# Patient Record
Sex: Female | Born: 1965 | Race: Black or African American | Hispanic: No | Smoking: Never smoker
Health system: Southern US, Community
[De-identification: ages and names within clinical notes are randomized; demographics above are authoritative.]

## PROBLEM LIST (undated history)

## (undated) DIAGNOSIS — E039 Hypothyroidism, unspecified: Secondary | ICD-10-CM

---

## 2015-12-01 HISTORY — PX: BIOPSY THYROID: PRO38

## 2018-07-30 ENCOUNTER — Encounter (HOSPITAL_COMMUNITY): Payer: Self-pay | Admitting: Emergency Medicine

## 2018-07-30 ENCOUNTER — Emergency Department (HOSPITAL_COMMUNITY): Payer: Self-pay

## 2018-07-30 ENCOUNTER — Observation Stay (HOSPITAL_COMMUNITY)
Admission: EM | Admit: 2018-07-30 | Discharge: 2018-07-31 | Disposition: A | Discharge status: Home / Self Care | Payer: Self-pay | Attending: Internal Medicine | Admitting: Internal Medicine

## 2018-07-30 DIAGNOSIS — M5021 Other cervical disc displacement,  high cervical region: Secondary | ICD-10-CM | POA: Insufficient documentation

## 2018-07-30 DIAGNOSIS — I2721 Secondary pulmonary arterial hypertension: Secondary | ICD-10-CM | POA: Insufficient documentation

## 2018-07-30 DIAGNOSIS — G9340 Encephalopathy, unspecified: Secondary | ICD-10-CM | POA: Insufficient documentation

## 2018-07-30 DIAGNOSIS — X58XXXD Exposure to other specified factors, subsequent encounter: Secondary | ICD-10-CM | POA: Insufficient documentation

## 2018-07-30 DIAGNOSIS — E876 Hypokalemia: Secondary | ICD-10-CM | POA: Insufficient documentation

## 2018-07-30 DIAGNOSIS — Z91040 Latex allergy status: Secondary | ICD-10-CM | POA: Insufficient documentation

## 2018-07-30 DIAGNOSIS — F419 Anxiety disorder, unspecified: Secondary | ICD-10-CM | POA: Insufficient documentation

## 2018-07-30 DIAGNOSIS — R55 Syncope and collapse: Principal | ICD-10-CM | POA: Insufficient documentation

## 2018-07-30 DIAGNOSIS — Z789 Other specified health status: Secondary | ICD-10-CM

## 2018-07-30 DIAGNOSIS — E039 Hypothyroidism, unspecified: Secondary | ICD-10-CM | POA: Diagnosis present

## 2018-07-30 DIAGNOSIS — Z79899 Other long term (current) drug therapy: Secondary | ICD-10-CM | POA: Insufficient documentation

## 2018-07-30 DIAGNOSIS — F32A Depression, unspecified: Secondary | ICD-10-CM | POA: Diagnosis present

## 2018-07-30 DIAGNOSIS — R4701 Aphasia: Secondary | ICD-10-CM | POA: Insufficient documentation

## 2018-07-30 DIAGNOSIS — M4802 Spinal stenosis, cervical region: Secondary | ICD-10-CM | POA: Insufficient documentation

## 2018-07-30 DIAGNOSIS — F329 Major depressive disorder, single episode, unspecified: Secondary | ICD-10-CM | POA: Insufficient documentation

## 2018-07-30 DIAGNOSIS — D509 Iron deficiency anemia, unspecified: Secondary | ICD-10-CM | POA: Insufficient documentation

## 2018-07-30 DIAGNOSIS — R4182 Altered mental status, unspecified: Secondary | ICD-10-CM

## 2018-07-30 DIAGNOSIS — Z8673 Personal history of transient ischemic attack (TIA), and cerebral infarction without residual deficits: Secondary | ICD-10-CM | POA: Insufficient documentation

## 2018-07-30 DIAGNOSIS — I340 Nonrheumatic mitral (valve) insufficiency: Secondary | ICD-10-CM | POA: Insufficient documentation

## 2018-07-30 DIAGNOSIS — E890 Postprocedural hypothyroidism: Secondary | ICD-10-CM | POA: Insufficient documentation

## 2018-07-30 DIAGNOSIS — S0232XD Fracture of orbital floor, left side, subsequent encounter for fracture with routine healing: Secondary | ICD-10-CM | POA: Insufficient documentation

## 2018-07-30 HISTORY — DX: Hypothyroidism, unspecified: E03.9

## 2018-07-30 LAB — I-STAT BETA HCG BLOOD, ED (MC, WL, AP ONLY)

## 2018-07-30 LAB — COMPREHENSIVE METABOLIC PANEL
ALBUMIN: 3.6 g/dL (ref 3.5–5.0)
ALT: 14 U/L (ref 0–44)
AST: 23 U/L (ref 15–41)
Alkaline Phosphatase: 60 U/L (ref 38–126)
Anion gap: 11 (ref 5–15)
BUN: 10 mg/dL (ref 6–20)
CHLORIDE: 104 mmol/L (ref 98–111)
CO2: 20 mmol/L — AB (ref 22–32)
Calcium: 8 mg/dL — ABNORMAL LOW (ref 8.9–10.3)
Creatinine, Ser: 0.95 mg/dL (ref 0.44–1.00)
GFR calc Af Amer: >60 mL/min (ref >60)
GFR calc non Af Amer: >60 mL/min (ref >60)
GLUCOSE: 169 mg/dL — AB (ref 70–99)
POTASSIUM: 3.2 mmol/L — AB (ref 3.5–5.1)
SODIUM: 135 mmol/L (ref 135–145)
Total Bilirubin: 0.5 mg/dL (ref 0.3–1.2)
Total Protein: 7.3 g/dL (ref 6.5–8.1)

## 2018-07-30 LAB — CBC
HEMATOCRIT: 29.9 % — AB (ref 36.0–46.0)
HEMOGLOBIN: 8.5 g/dL — AB (ref 12.0–15.0)
MCH: 19.5 pg — ABNORMAL LOW (ref 26.0–34.0)
MCHC: 28.4 g/dL — ABNORMAL LOW (ref 30.0–36.0)
MCV: 68.4 fL — ABNORMAL LOW (ref 78.0–100.0)
Platelets: 199 10*3/uL (ref 150–400)
RBC: 4.37 MIL/uL (ref 3.87–5.11)
RDW: 18.1 % — ABNORMAL HIGH (ref 11.5–15.5)
WBC: 4.3 10*3/uL (ref 4.0–10.5)

## 2018-07-30 LAB — I-STAT CHEM 8, ED
BUN: 10 mg/dL (ref 6–20)
CHLORIDE: 106 mmol/L (ref 98–111)
Calcium, Ion: 1.02 mmol/L — ABNORMAL LOW (ref 1.15–1.40)
Creatinine, Ser: 0.9 mg/dL (ref 0.44–1.00)
Glucose, Bld: 163 mg/dL — ABNORMAL HIGH (ref 70–99)
HEMATOCRIT: 30 % — AB (ref 36.0–46.0)
Hemoglobin: 10.2 g/dL — ABNORMAL LOW (ref 12.0–15.0)
Potassium: 3.2 mmol/L — ABNORMAL LOW (ref 3.5–5.1)
SODIUM: 139 mmol/L (ref 135–145)
TCO2: 22 mmol/L (ref 22–32)

## 2018-07-30 LAB — TSH: TSH: 10.639 u[IU]/mL — ABNORMAL HIGH (ref 0.350–4.500)

## 2018-07-30 LAB — I-STAT CG4 LACTIC ACID, ED
Lactic Acid, Venous: 3.01 mmol/L (ref 0.5–1.9)
Lactic Acid, Venous: 1.06 mmol/L (ref 0.5–1.9)

## 2018-07-30 LAB — T4, FREE: Free T4: 0.83 ng/dL (ref 0.82–1.77)

## 2018-07-30 LAB — I-STAT TROPONIN, ED: Troponin i, poc: 0 ng/mL (ref 0.00–0.08)

## 2018-07-30 LAB — CBG MONITORING, ED: GLUCOSE-CAPILLARY: 143 mg/dL — AB (ref 70–99)

## 2018-07-30 MED ORDER — SODIUM CHLORIDE 0.9 % IV BOLUS
1000.0000 mL | Freq: Once | INTRAVENOUS | Status: AC
  Administered 2018-07-30: 1000 mL via INTRAVENOUS

## 2018-07-30 MED ORDER — IOPAMIDOL (ISOVUE-370) INJECTION 76%
50.0000 mL | Freq: Once | INTRAVENOUS | Status: AC | PRN
  Administered 2018-07-30: 50 mL via INTRAVENOUS

## 2018-07-30 NOTE — ED Notes (Signed)
Taken to MRI 

## 2018-07-30 NOTE — ED Notes (Signed)
I Stat [place] Acid results of 3.01 reported to Graciella FreerLindsey Layden PA

## 2018-07-30 NOTE — ED Provider Notes (Signed)
[facility] EMERGENCY DEPARTMENT Provider Note   CSN: [id number] Arrival date & time: 07/30/18  2135     History   Chief Complaint Chief Complaint  Patient presents with  . Altered Mental Status    HPI Lisa Quinn is a 52 y.o. female possible history of thyroid issues who presents for evaluation of altered mental status.  Most of history is provided by EMS.  Patient was at a football game with her family.  They report that patient was standing up in cheering when all of a sudden she had a syncopal episode resulting in her falling backwards.  Family reported no seizure activity.  They state patient did have positive LOC.  They state that she hit her back on the bleacher but did not hit her head.  Since then, patient has had difficulty responding.  EMS reported that patient would only blink and move her eyes.  They report no verbal responses from patient.  Family at bedside states that patient had been in her normal state of health earlier today.  They reported that she had reported some slight dizziness earlier this afternoon but after not had felt better.  They state that she did not have any preceding chest pain or dizziness.   EM level 5 caveat due to patient's AMS.   The history is provided by the EMS personnel.    History reviewed. No pertinent past medical history.  Patient Active Problem List   Diagnosis Date Noted  . Syncope 07/31/2018  . Microcytic anemia 07/31/2018  . Hypokalemia 07/31/2018  . Hypothyroidism 07/31/2018  . Acute encephalopathy 07/31/2018    The histories are not reviewed yet. Please review them in the "History" navigator section and refresh this SmartLink.   OB History   None      Home Medications    Prior to Admission medications   Not on File    Family History History reviewed. No pertinent family history.  Social History Social History   Tobacco Use  . Smoking status: Unknown If Ever Smoked  Substance Use Topics   . Alcohol use: Not on file  . Drug use: Not on file     Allergies   Patient has no allergy information on record.   Review of Systems Review of Systems  Unable to perform ROS: Mental status change     Physical Exam Updated Vital Signs BP 127/83   Pulse 63   Temp 98.7 F (37.1 C) (Axillary)   Resp 20   Ht 5\' 9"  (1.753 m)   Wt 86.2 kg   LMP 07/30/2018 (Exact Date)   SpO2 100%   BMI 28.06 kg/m   Physical Exam  Constitutional: She appears well-developed and well-nourished.  HENT:  Head: Normocephalic and atraumatic.  Mouth/Throat: Oropharynx is clear and moist and mucous membranes are normal.  No tenderness to palpation of skull. No deformities or crepitus noted. No open wounds, abrasions or lacerations.   Eyes: Conjunctivae and lids are normal.  Left pupil is fixed and dilated    Neck: Full passive range of motion without pain.  Cardiovascular: Normal rate, regular rhythm, normal heart sounds and normal pulses. Exam reveals no gallop and no friction rub.  No murmur heard. Pulmonary/Chest: Effort normal and breath sounds normal.  Lungs clear to auscultation bilaterally.  Symmetric chest rise.  No wheezing, rales, rhonchi. No tenderness to palpation noted to anterior chest well.   Abdominal: Soft. Normal appearance. There is no tenderness. There is no rigidity and no  guarding.  Abdomen is soft, non-distended, non-tender. No rigidity, No guarding. No peritoneal signs.  Musculoskeletal: Normal range of motion.  Neurological: She is alert.  Patient will open her eyes to verbal stimuli.  Initially she would not respond.  By the end of my evaluation, she would tell me her name and would spontaneously move her extremities.  She will intermittently follow commands but difficulty moving all extremities.  Patient is protecting airway.  Skin: Skin is warm and dry. Capillary refill takes less than 2 seconds.  Psychiatric:  Unable to assess secondary to patient's AMS.  Nursing note  and vitals reviewed.    ED Treatments / Results  Labs (all labs ordered are listed, but only abnormal results are displayed) Labs Reviewed  COMPREHENSIVE METABOLIC PANEL - Abnormal; Notable for the following components:      Result Value   Potassium 3.2 (*)    CO2 20 (*)    Glucose, Bld 169 (*)    Calcium 8.0 (*)    All other components within normal limits  CBC - Abnormal; Notable for the following components:   Hemoglobin 8.5 (*)    HCT 29.9 (*)    MCV 68.4 (*)    MCH 19.5 (*)    MCHC 28.4 (*)    RDW 18.1 (*)    All other components within normal limits  TSH - Abnormal; Notable for the following components:   TSH 10.639 (*)    All other components within normal limits  CBG MONITORING, ED - Abnormal; Notable for the following components:   Glucose-Capillary 143 (*)    All other components within normal limits  I-STAT CG4 LACTIC ACID, ED - Abnormal; Notable for the following components:   Lactic Acid, Venous 3.01 (*)    All other components within normal limits  I-STAT CHEM 8, ED - Abnormal; Notable for the following components:   Potassium 3.2 (*)    Glucose, Bld 163 (*)    Calcium, Ion 1.02 (*)    Hemoglobin 10.2 (*)    HCT 30.0 (*)    All other components within normal limits  T4, FREE  ETHANOL  PROTIME-INR  APTT  URINALYSIS, ROUTINE W REFLEX MICROSCOPIC  RAPID URINE DRUG SCREEN, HOSP PERFORMED  DIFFERENTIAL  I-STAT BETA HCG BLOOD, ED ([facility], [facility], [facility] ONLY)  I-STAT TROPONIN, ED  I-STAT CG4 LACTIC ACID, ED    EKG EKG Interpretation  Date/Time:  Saturday July 30 2018 21:43:27 EDT Ventricular Rate:  80 PR Interval:    QRS Duration: 102 QT Interval:  419 QTC Calculation: 484 R Axis:   51 Text Interpretation:  Sinus rhythm Borderline T abnormalities, anterior leads Confirmed by Virgina Norfolk [id number]) on 07/30/2018 9:57:42 PM   Radiology Ct Angio Head W Or Wo Contrast  Result Date: 07/30/2018 CLINICAL DATA:  Confusion. Syncopal episode. Found unresponsive  between bleachers at football game. Last seen normal at 2130 hours. EXAM: CT ANGIOGRAPHY HEAD AND NECK TECHNIQUE: Multidetector CT imaging of the head and neck was performed using the standard protocol during bolus administration of intravenous contrast. Multiplanar CT image reconstructions and MIPs were obtained to evaluate the vascular anatomy. Carotid stenosis measurements (when applicable) are obtained utilizing NASCET criteria, using the distal internal carotid diameter as the denominator. CONTRAST:  50mL ISOVUE-370 IOPAMIDOL (ISOVUE-370) INJECTION 76% COMPARISON:  CT HEAD July 20, 2018 at 2218 hours. FINDINGS: CTA NECK FINDINGS: AORTIC ARCH: Normal appearance of the thoracic arch, normal branch pattern. The origins of the innominate, left Common carotid artery and subclavian  artery are widely patent. RIGHT CAROTID SYSTEM: Common carotid artery is patent. Normal appearance of the carotid bifurcation without hemodynamically significant stenosis by NASCET criteria. Normal appearance of the internal carotid artery. LEFT CAROTID SYSTEM: Common carotid artery is patent. Normal appearance of the carotid bifurcation without hemodynamically significant stenosis by NASCET criteria. Normal appearance of the internal carotid artery. VERTEBRAL ARTERIES:Codominant vertebral arteries, widely patent. Mild extrinsic compression due to cervical spine. SKELETON: No acute osseous process though bone windows have not been submitted. OTHER NECK: Soft tissues of the neck are nonacute though, not tailored for evaluation. Thoracic dextroscoliosis. Coarse calcification anterior neck. UPPER CHEST: Biapical pleural thickening. LEFT apical granuloma. 3.7 cm enlarged main pulmonary artery associated with pulmonary arterial hypertension, incompletely evaluated. Status post thyroidectomy, subcentimeter residual thyroid tissue. Surgical clips in anterior mediastinum. Status post median sternotomy. CTA HEAD FINDINGS: ANTERIOR CIRCULATION:  Patent cervical internal carotid arteries, petrous, cavernous and supra clinoid internal carotid arteries. Patent anterior communicating artery. Patent anterior and middle cerebral arteries, mild luminal irregularity. No large vessel occlusion, significant stenosis, contrast extravasation or aneurysm. POSTERIOR CIRCULATION: Patent vertebral arteries, vertebrobasilar junction and basilar artery, as well as main branch vessels. Mild stenosis RIGHT V4 segment. Patent posterior cerebral arteries, mild luminal irregularity. Small bilateral posterior communicating arteries present. No large vessel occlusion, significant stenosis, contrast extravasation or aneurysm. VENOUS SINUSES: Major dural venous sinuses are patent though not tailored for evaluation on this angiographic examination. ANATOMIC VARIANTS: Hypoplastic RIGHT A1 segment.  LEFT DELAYED PHASE: Not performed. MIP images reviewed. IMPRESSION: CTA NECK: 1. No hemodynamically significant stenosis or acute vascular process in the neck. 2. Postoperative changes mediastinal, status post median sternotomy. 3. Enlarged main pulmonary artery associated with pulmonary arterial hypertension. CTA HEAD: 1. No emergent large vessel occlusion or flow-limiting stenosis. 2. Mild luminal irregularities seen with atherosclerosis, less likely vasculopathy. Critical Value/emergent results text paged to Dr.ASHISH ARORA via AMION secure system on 07/30/2018 at 10:35 pm, including interpreting physician's phone number. Electronically Signed   By: Awilda Metro M.D.   On: 07/30/2018 22:45   Ct Angio Neck W And/or Wo Contrast  Result Date: 07/30/2018 CLINICAL DATA:  Confusion. Syncopal episode. Found unresponsive between bleachers at football game. Last seen normal at 2130 hours. EXAM: CT ANGIOGRAPHY HEAD AND NECK TECHNIQUE: Multidetector CT imaging of the head and neck was performed using the standard protocol during bolus administration of intravenous contrast. Multiplanar CT  image reconstructions and MIPs were obtained to evaluate the vascular anatomy. Carotid stenosis measurements (when applicable) are obtained utilizing NASCET criteria, using the distal internal carotid diameter as the denominator. CONTRAST:  50mL ISOVUE-370 IOPAMIDOL (ISOVUE-370) INJECTION 76% COMPARISON:  CT HEAD July 20, 2018 at 2218 hours. FINDINGS: CTA NECK FINDINGS: AORTIC ARCH: Normal appearance of the thoracic arch, normal branch pattern. The origins of the innominate, left Common carotid artery and subclavian artery are widely patent. RIGHT CAROTID SYSTEM: Common carotid artery is patent. Normal appearance of the carotid bifurcation without hemodynamically significant stenosis by NASCET criteria. Normal appearance of the internal carotid artery. LEFT CAROTID SYSTEM: Common carotid artery is patent. Normal appearance of the carotid bifurcation without hemodynamically significant stenosis by NASCET criteria. Normal appearance of the internal carotid artery. VERTEBRAL ARTERIES:Codominant vertebral arteries, widely patent. Mild extrinsic compression due to cervical spine. SKELETON: No acute osseous process though bone windows have not been submitted. OTHER NECK: Soft tissues of the neck are nonacute though, not tailored for evaluation. Thoracic dextroscoliosis. Coarse calcification anterior neck. UPPER CHEST: Biapical pleural thickening. LEFT apical granuloma. 3.7  cm enlarged main pulmonary artery associated with pulmonary arterial hypertension, incompletely evaluated. Status post thyroidectomy, subcentimeter residual thyroid tissue. Surgical clips in anterior mediastinum. Status post median sternotomy. CTA HEAD FINDINGS: ANTERIOR CIRCULATION: Patent cervical internal carotid arteries, petrous, cavernous and supra clinoid internal carotid arteries. Patent anterior communicating artery. Patent anterior and middle cerebral arteries, mild luminal irregularity. No large vessel occlusion, significant stenosis,  contrast extravasation or aneurysm. POSTERIOR CIRCULATION: Patent vertebral arteries, vertebrobasilar junction and basilar artery, as well as main branch vessels. Mild stenosis RIGHT V4 segment. Patent posterior cerebral arteries, mild luminal irregularity. Small bilateral posterior communicating arteries present. No large vessel occlusion, significant stenosis, contrast extravasation or aneurysm. VENOUS SINUSES: Major dural venous sinuses are patent though not tailored for evaluation on this angiographic examination. ANATOMIC VARIANTS: Hypoplastic RIGHT A1 segment.  LEFT DELAYED PHASE: Not performed. MIP images reviewed. IMPRESSION: CTA NECK: 1. No hemodynamically significant stenosis or acute vascular process in the neck. 2. Postoperative changes mediastinal, status post median sternotomy. 3. Enlarged main pulmonary artery associated with pulmonary arterial hypertension. CTA HEAD: 1. No emergent large vessel occlusion or flow-limiting stenosis. 2. Mild luminal irregularities seen with atherosclerosis, less likely vasculopathy. Critical Value/emergent results text paged to Dr.ASHISH ARORA via AMION secure system on 07/30/2018 at 10:35 pm, including interpreting physician's phone number. Electronically Signed   By: Awilda Metro M.D.   On: 07/30/2018 22:45   Ct C-spine No Charge  Result Date: 07/30/2018 CLINICAL DATA:  Found unresponsive between bleachers at football game. EXAM: CT CERVICAL SPINE WITHOUT CONTRAST TECHNIQUE: Reconstructed CT imaging of the cervical spine was performed. COMPARISON:  None. FINDINGS: ALIGNMENT: Maintained lordosis. Vertebral bodies in alignment. SKULL BASE AND VERTEBRAE: Cervical vertebral bodies and posterior elements are intact. Developmentally long gated C6 spinous process directed to the LEFT. Dystrophic RIGHT C6 transverse process, anterior tubercle of C6 and C7 pseudoarthrosis. The posterior elements of T1 are developmentally unfused. Intervertebral disc heights preserved.  No destructive bony lesions. C1-2 articulation maintained. SOFT TISSUES AND SPINAL CANAL: Nonacute. DISC LEVELS: Moderate C3-4 disc protrusion resulting in at least moderate canal stenosis UPPER CHEST: Lung apices are clear. OTHER: None. IMPRESSION: 1. No fracture or malalignment. 2. Disc protrusion resulting in at least moderate canal stenosis C3-4. Electronically Signed   By: Awilda Metro M.D.   On: 07/30/2018 23:15   Ct Head Code Stroke Wo Contrast  Addendum Date: 07/30/2018   ADDENDUM REPORT: 07/30/2018 22:33 ADDENDUM: Partially empty sella. Electronically Signed   By: Awilda Metro M.D.   On: 07/30/2018 22:33   Result Date: 07/30/2018 CLINICAL DATA:  Code stroke.  Syncope and confusion. EXAM: CT HEAD WITHOUT CONTRAST TECHNIQUE: Contiguous axial images were obtained from the base of the skull through the vertex without intravenous contrast. COMPARISON:  None. FINDINGS: BRAIN: No intraparenchymal hemorrhage, mass effect nor midline shift. The ventricles and sulci are normal. No acute large vascular territory infarcts. No abnormal extra-axial fluid collections. Basal cisterns are patent. VASCULAR: Trace calcific atherosclerosis. SKULL/SOFT TISSUES: No skull fracture. No significant soft tissue swelling. ORBITS/SINUSES: Old LEFT medial orbital blowout fracture. Paranasal sinuses are well aerated. Mastoid air cells are well aerated. OTHER: None. ASPECTS [facility] Stroke Program Early CT Score) - Ganglionic level infarction (caudate, lentiform nuclei, internal capsule, insula, M1-M3 cortex): 7 - Supraganglionic infarction (M4-M6 cortex): 3 Total score (0-10 with 10 being normal): 10 IMPRESSION: 1. Negative CT HEAD without contrast. 2. ASPECTS is 10. 3. Critical Value/emergent results text paged to Dr.Arora, Neurology via AMION secure system on 07/30/2018 at 10:24 pm, including  interpreting physician's phone number. Electronically Signed: By: Awilda Metroourtnay  Bloomer M.D. On: 07/30/2018 22:25     Procedures .Critical Care Performed by: Maxwell CaulLayden, Esma Kilts A, PA-C Authorized by: Maxwell CaulLayden, Damoni Erker A, PA-C   Critical care provider statement:    Critical care time (minutes):  45   Critical care was time spent personally by me on the following activities:  Discussions with consultants, evaluation of patient's response to treatment, examination of patient, ordering and performing treatments and interventions, ordering and review of laboratory studies, ordering and review of radiographic studies, pulse oximetry, re-evaluation of patient's condition, obtaining history from patient or surrogate and review of old charts   (including critical care time)  Medications Ordered in ED Medications  iopamidol (ISOVUE-370) 76 % injection 50 mL (50 mLs Intravenous Contrast Given 07/30/18 2231)  sodium chloride 0.9 % bolus 1,000 mL (1,000 mLs Intravenous New Bag/Given 07/30/18 2322)     Initial Impression / Assessment and Plan / ED Course  I have reviewed the triage vital signs and the nursing notes.  Pertinent labs & imaging results that were available during my care of the patient were reviewed by me and considered in my medical decision making (see chart for details).     52 y.o. female brought in by EMS for AMS.  Patient was at a football game when all of a sudden she had a syncopal episode and lost consciousness.  Has had difficulty responding since then.  On initial EMS arrival, patient was unresponsive.  They state that she was breathing and would open her eyes but would not answer any questions.  On my evaluation, she started responding to verbal stimuli and will tell me her name.  She is able to squeeze my fingers and has spontaneous movement of her extremities but has difficulty following commands.  Left pupil was fixed and dilated on my exam.  Given concerns, a code stroke was initiated.  Discussed with Dr. Wilford CornerArora.  And for labs, CT head, CT C-spine.  CT C-spine shows no evidence of fracture.  CT  head negative for any acute intracranial hemorrhage.  I-STAT Chem-8 shows potassium 3.2.  CMP shows bicarb of 20.  Normal BUN and creatinine.  CBC shows hemoglobin is 8.5.  Review of patient records show that she does have a history of anemia.  Last CBC was approximately 1 year ago and her hemoglobin was 9 at that time.  Review of records show that she consistently runs between 8 and 10.  CBC without any significant leukocytosis.  CBG is 143.  I-STAT beta negative.  I-STAT troponin negative.  Discussed with Dr. Wilford CornerArora (Neuro) after CT had and personal evaluation.  Recommends obtaining CT a of head and neck and MRI of brain for stroke.  He believes that the eye is been from previous trauma.  Does not feel that it is acutely fixed and dilated.  He recommends reevaluation after imaging.  He will plan to consult.  Low suspicion for stroke at this time.  He will plan to consult after MRI for reevaluation.  Reevaluation.  Patient is spontaneously moving all extremities and is more responsive but is still not back to baseline.  Given concerns for altered mental status and atypical syncope, feel that admission for observation is best course.  Discussed patient with Dr. Antionette Charpyd (hospitalist).  Will admit patient.  Discussed patient with Dr. Wilford CornerArora (Neuro).  Given normal MRI, do not feel that this needs further neurology work-up.  Do not suspect that this is seizure.  He  will insert consult note and will be available for consult as needed but from a neuro standpoint, feel that patient is clear.  Final Clinical Impressions(s) / ED Diagnoses   Final diagnoses:  Altered mental status, unspecified altered mental status type  Syncope, unspecified syncope type    ED Discharge Orders    None       Maxwell Caul, PA-C 07/31/18 0159    Virgina Norfolk, DO 07/31/18 1109

## 2018-07-30 NOTE — ED Notes (Signed)
PAGED OUT STROKE @ 22:03 PER DR CURATOLO

## 2018-07-30 NOTE — Consult Note (Signed)
Neurology Consultation  Reason for Consult: Code stroke-weakness aphasia Referring Physician: Dr. Lockie Molauratolo  CC: Weakness aphasia after syncopal episode  History is obtained from: Patient's family at bedside  HPI: Lisa Quinn is a 52 y.o. female past medical history of hypothyroidism, depression/anxiety, fibrocystic breast disease, and anemia, who was visiting her son for his local college football game, and was sitting in the stadium in the bleachers when she had a sudden onset of fall backwards with questionable hitting her head on the bench followed by unresponsiveness and some stiffening of her body.  She was brought into the emergency room, evaluated by the ED providers and a code stroke was activated for concern for asymmetric pupils, aphasia and generalized weakness. The patient has not had similar episodes in the past per family.  She was recently diagnosed with fibrocystic breast disease and has had a lot of pain in her breast and started on opiates. [place] visiting from the [place] where she has [facility] care [facility]. She has been under a lot of personal stress lately according to another friend who was in the room. The son also said that she has a history of stroke in her early 6230s, reason unknown, no residual deficits.  She underwent thyroid surgery for removal of a goiter 2 years ago and had a prior thyroid surgery in 2007 for similar thyroid issues. Her current medications include citalopram 20, Naprosyn, oxycodone, levothyroxine. EMS did not notice seizure activity.  No meds were given. Initial systolic blood pressure was in the 150s 160s.  Repeat blood pressures in the 1 teens.  LKW: 9:45 PM 07/30/2018 tpa given?: no, nonfocal exam Premorbid modified Rankin scale (mRS): 0 [id number]  ROS: Unable to obtain due to altered mental status.   No past medical history on file. Hypothyroidism Depression/Anxiety Anemia   No family history on file. Both  parents are alive.  No history of strokes or cardiac disease in the family.  Social History:   has no tobacco, alcohol, and drug history on file. Does not smoke, consume excessive alcohol or use illicit drugs.  Medications  Current Facility-Administered Medications:  .  sodium chloride 0.9 % bolus 1,000 mL, 1,000 mL, Intravenous, Once, Curatolo, Adam, DO No current outpatient medications on file.  Exam: Current vital signs: Temp 98.7 F (37.1 C) (Axillary)   Ht 5\' 9"  (1.753 m)   Wt 86.2 kg   BMI 28.06 kg/m  Vital signs in last 24 hours: Temp:  [98.7 F (37.1 C)] 98.7 F (37.1 C) (08/31 2142) Weight:  [86.2 kg] 86.2 kg (08/31 2143) General: Patient is drowsy, in a cervical collar, no apparent distress. HEENT: Her neck and collar, dry oral mucous membranes CVS: S1-S2 heard, regular rate rhythm Respiratory: Chest clear to auscultation Abdomen: Nondistended nontender Extremities: Scars on both feet from prior surgery but no edema. Neurological exam She is drowsy, opens her eyes to voice. Initially did not follow commands but right after the CT scan examined again and followed most commands. Her speech is hypophonic, mild dysarthria. Naming, comprehension and repetition are essentially intact. Reduced attention concentration Cranial nerves: Left pupil is irregular and very less reactive- traumatic injury many years ago per family to that eye, right pupil is 3 mm normally reactive to light, extraocular movements intact, visual fields full to threat, face is symmetric, tongue midline, palate elevates midline. Motor exam: She is unable to raise any of her extremities against gravity.  At times it felt like the upper extremities were  supported against gravity but then she let go in and almost effort dependent type of exam. Sensory exam: She denied being able to perceive sensation to light touch in both her lower extremities but was able to perceive light touch in the upper  extremities She did not perform finger-nose-finger testing Gait testing could not be preferred due to her current mentation and exam DTRs: Hyperreflexic all over with at least 2+ to 3+  Labs I have reviewed labs in epic and the results pertinent to this consultation are: Initial lactate 3.0 Globin 10.2 MCV 68.4 Sodium 139 Potassium 3.2 Glucose 163 Calcium 8.0   CBC    Component Value Date/Time   WBC 4.3 07/30/2018 2145   RBC 4.37 07/30/2018 2145   HGB 10.2 (L) 07/30/2018 2207   HCT 30.0 (L) 07/30/2018 2207   PLT 199 07/30/2018 2145   MCV 68.4 (L) 07/30/2018 2145   MCH 19.5 (L) 07/30/2018 2145   MCHC 28.4 (L) 07/30/2018 2145   RDW 18.1 (H) 07/30/2018 2145  CMP     Component Value Date/Time   NA 139 07/30/2018 2207   K 3.2 (L) 07/30/2018 2207   CL 106 07/30/2018 2207   CO2 20 (L) 07/30/2018 2145   GLUCOSE 163 (H) 07/30/2018 2207   BUN 10 07/30/2018 2207   CREATININE 0.90 07/30/2018 2207   CALCIUM 8.0 (L) 07/30/2018 2145   PROT 7.3 07/30/2018 2145   ALBUMIN 3.6 07/30/2018 2145   AST 23 07/30/2018 2145   ALT 14 07/30/2018 2145   ALKPHOS 60 07/30/2018 2145   BILITOT 0.5 07/30/2018 2145   GFRNONAA >60 07/30/2018 2145   GFRAA >60 07/30/2018 2145  TSH 10.6  Imaging I have reviewed the images obtained: CT-scan of the brain-no acute changes.  Aspects 10.  No bleed. CTA head and neck-no LVO.  The CT neck, postoperative changes in mediastinum, status post median sternotomy.  Enlarged main pulmonary artery.  Mild luminal irregularities with atherosclerosis in the CTA head  Assessment:  52 year old woman with past history of hypothyroidism depression anxiety fiber cystic breast disease and anemia, was brought in for evaluation from a local stadium where she was attending a football game and had a sudden onset of loss of consciousness with hitting her head and the back followed by what was described as a aphasia and unequal pupils. Her left pupil is slightly atraumatic pupil  and does not represent any acute pathology. She was starting to come around and answering questions and appropriately following commands but was overall generally weak and was unable to lift her extremities against gravity. Her examination had somewhat of a nonorganic effort dependent component to it as well, which could be secondary to a postconcussive or post syncopal event and less likely due to a postictal event because she has no history of seizures and no seizures were actually witnessed. Given the history of stroke at a young age by family, although they could not give me any more details, it might be prudent to get further brain imaging by MRI. Also, this would give [place] some time to observe her clinically and do more neurochecks.  She also recently started taking opiates, and her syncopal episode could be from adverse reaction to medications.  Not a candidate for TPA due to nonfocal symptoms-had all 4 extremities week and not able to lift antigravity as well as LOC questions, more likely encephalopathy/postconcussive rather than a true stroke. No evidence of LVO on vascular imaging-not a candidate for endovascular thrombectomy.  Impression: Evaluate for  etiology of syncopal episode R/o spinal cord injury Evaluate for stroke Less likely to be a seizure Could be toxic metabolic encephalopathy from recent medications  hypothyroidism Anemia-likely iron deficiency and chronic  Recommendations: MRI brain without contrast stat MRI C-spine stat IV fluids Frequent neurochecks Urinary toxicology screen If the MRI of the brain and C-spine are unremarkable, no further neurological intervention at that time.  She should follow-up with her physicians close to home and get evaluated for possible cardiogenic syncope. In case the MRI comes back abnormal, further recommendations based on test results. I have relayed my plan to the family and the ED providers.  Delays in the code stroke process:  Activation of the code strokes many minutes after patient arrival to the ER. -- Milon Dikes, MD [facility] Pager: [phone] If 7pm to 7am, please call on call as listed on AMION.  CRITICAL CARE ATTESTATION This patient is critically ill and at significant risk of neurological worsening, death and care requires constant monitoring of vital signs, hemodynamics,respiratory and cardiac monitoring. I spent 45  minutes of neurocritical care time performing neurological assessment, discussion with family, other specialists and medical decision making of high complexityin the care of  this patient.   Addendum MRI of the brain and C-spine completed and reviewed.  No acute stroke, no acute bleed. 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 Patient is clinically improving and returning back to her baseline. My suspicion for seizure is low based on the description by the family, as well as description of the event.  It is more likely syncope followed by may be questionable syncopal convulsion that did not last for a considerable time. I do not recommend any further neurological work-up or antiepileptics at this time.   Should she have another episode that is concerning for seizures, at that time seizure work-up and antiepileptics should be considered. Medical work-up per primary team for causes of syncope. Please call neurology service as needed  -- Milon Dikes, MD [facility] Pager: [phone] If 7pm to 7am, please call on call as listed on AMION.

## 2018-07-30 NOTE — Code Documentation (Addendum)
Code Stroke   Called at 2203. LSN 2130. Came in with EMS from local college football game, where family witness the patient fall back and patient landed in-between bleachers. Blood sugar and VS okay. Per ED staff, patient was not talking, weakness, irregular pupil ?Marland Kitchen. Upon exam, NIH 16.   Will continue neuro checks til 0200. CT HEAD - , CTA H/N - for LVO, C-Spine pending. Lactic acid was 3 and HB was 8, history of CVA (in her late 30s, thyroid surgery, rib surgery, fibrocystic breast pain, recently admitted at outside hospital for breast pain (Vicodin, Naproxen)). MRI STAT ordered and repeat labs and fluids.   Arrival Time 2205 End Time 2305

## 2018-07-30 NOTE — ED Triage Notes (Signed)
Brought by ems from football game.  Called out initially for seizure.  Per family no bodily movements just unresponsive.  On arrival of ems found wedged between bleacher where she fell after becoming unresponsive.  Initially of answering or responding.  Now answering some questions.

## 2018-07-31 ENCOUNTER — Emergency Department (HOSPITAL_COMMUNITY): Payer: Self-pay

## 2018-07-31 ENCOUNTER — Observation Stay (HOSPITAL_BASED_OUTPATIENT_CLINIC_OR_DEPARTMENT_OTHER): Payer: Self-pay

## 2018-07-31 ENCOUNTER — Other Ambulatory Visit: Payer: Self-pay

## 2018-07-31 ENCOUNTER — Encounter (HOSPITAL_COMMUNITY): Payer: Self-pay | Admitting: Family Medicine

## 2018-07-31 DIAGNOSIS — E039 Hypothyroidism, unspecified: Secondary | ICD-10-CM | POA: Diagnosis present

## 2018-07-31 DIAGNOSIS — R55 Syncope and collapse: Secondary | ICD-10-CM | POA: Diagnosis present

## 2018-07-31 DIAGNOSIS — I340 Nonrheumatic mitral (valve) insufficiency: Secondary | ICD-10-CM

## 2018-07-31 DIAGNOSIS — F329 Major depressive disorder, single episode, unspecified: Secondary | ICD-10-CM | POA: Diagnosis present

## 2018-07-31 DIAGNOSIS — F32A Depression, unspecified: Secondary | ICD-10-CM | POA: Diagnosis present

## 2018-07-31 DIAGNOSIS — E876 Hypokalemia: Secondary | ICD-10-CM | POA: Diagnosis present

## 2018-07-31 DIAGNOSIS — D509 Iron deficiency anemia, unspecified: Secondary | ICD-10-CM | POA: Diagnosis present

## 2018-07-31 DIAGNOSIS — G9340 Encephalopathy, unspecified: Secondary | ICD-10-CM

## 2018-07-31 LAB — URINALYSIS, ROUTINE W REFLEX MICROSCOPIC
Bacteria, UA: NONE SEEN
Bilirubin Urine: NEGATIVE
GLUCOSE, UA: NEGATIVE mg/dL
Ketones, ur: NEGATIVE mg/dL
Leukocytes, UA: NEGATIVE
Nitrite: NEGATIVE
Protein, ur: 30 mg/dL — AB
SPECIFIC GRAVITY, URINE: 1.008 (ref 1.005–1.030)
pH: 8 (ref 5.0–8.0)

## 2018-07-31 LAB — BASIC METABOLIC PANEL
ANION GAP: 7 (ref 5–15)
BUN: 7 mg/dL (ref 6–20)
CO2: 21 mmol/L — AB (ref 22–32)
CREATININE: 0.68 mg/dL (ref 0.44–1.00)
Calcium: 7.6 mg/dL — ABNORMAL LOW (ref 8.9–10.3)
Chloride: 109 mmol/L (ref 98–111)
GFR calc Af Amer: >60 mL/min (ref >60)
GLUCOSE: 84 mg/dL (ref 70–99)
Potassium: 3.9 mmol/L (ref 3.5–5.1)
Sodium: 137 mmol/L (ref 135–145)

## 2018-07-31 LAB — PROTIME-INR
INR: 1.1
Prothrombin Time: 14.1 seconds (ref 11.4–15.2)

## 2018-07-31 LAB — RAPID URINE DRUG SCREEN, HOSP PERFORMED
AMPHETAMINES: NOT DETECTED
BENZODIAZEPINES: NOT DETECTED
Barbiturates: NOT DETECTED
COCAINE: NOT DETECTED
OPIATES: NOT DETECTED
TETRAHYDROCANNABINOL: NOT DETECTED

## 2018-07-31 LAB — GLUCOSE, CAPILLARY: Glucose-Capillary: 94 mg/dL (ref 70–99)

## 2018-07-31 LAB — DIFFERENTIAL
BASOS PCT: 1 %
Basophils Absolute: 0.1 10*3/uL (ref 0.0–0.1)
Eosinophils Absolute: 0.1 10*3/uL (ref 0.0–0.7)
Eosinophils Relative: 2 %
Lymphocytes Relative: 45 %
Lymphs Abs: 1.9 10*3/uL (ref 0.7–4.0)
MONOS PCT: 17 %
Monocytes Absolute: 0.7 10*3/uL (ref 0.1–1.0)
NEUTROS ABS: 1.5 10*3/uL — AB (ref 1.7–7.7)
Neutrophils Relative %: 35 %

## 2018-07-31 LAB — IRON AND TIBC
IRON: 21 ug/dL — AB (ref 28–170)
Saturation Ratios: 5 % — ABNORMAL LOW (ref 10.4–31.8)
TIBC: 409 ug/dL (ref 250–450)
UIBC: 388 ug/dL

## 2018-07-31 LAB — ECHOCARDIOGRAM COMPLETE
Height: 69 in
Weight: 3425.07 oz

## 2018-07-31 LAB — HIV ANTIBODY (ROUTINE TESTING W REFLEX): HIV SCREEN 4TH GENERATION: NONREACTIVE

## 2018-07-31 LAB — APTT: APTT: 35 s (ref 24–36)

## 2018-07-31 LAB — ETHANOL: Alcohol, Ethyl (B): <10 mg/dL (ref <10)

## 2018-07-31 LAB — FERRITIN: FERRITIN: 4 ng/mL — AB (ref 11–307)

## 2018-07-31 MED ORDER — ONDANSETRON HCL 4 MG PO TABS: 4.0000 mg | ORAL_TABLET | Freq: Four times a day (QID) | ORAL | Status: DC | PRN

## 2018-07-31 MED ORDER — POTASSIUM CHLORIDE IN NACL 40-0.9 MEQ/L-% IV SOLN
INTRAVENOUS | Status: AC
  Administered 2018-07-31: 100 mL/h via INTRAVENOUS
  Filled 2018-07-31: qty 1000

## 2018-07-31 MED ORDER — ACETAMINOPHEN 650 MG RE SUPP: 650.0000 mg | Freq: Four times a day (QID) | RECTAL | Status: DC | PRN

## 2018-07-31 MED ORDER — ACETAMINOPHEN 325 MG PO TABS: 650.0000 mg | ORAL_TABLET | Freq: Four times a day (QID) | ORAL | Status: DC | PRN

## 2018-07-31 MED ORDER — HYDROCODONE-ACETAMINOPHEN 5-325 MG PO TABS: 1.0000 | ORAL_TABLET | ORAL | Status: DC | PRN

## 2018-07-31 MED ORDER — POTASSIUM CHLORIDE IN NACL 40-0.9 MEQ/L-% IV SOLN
INTRAVENOUS | Status: DC
  Filled 2018-07-31: qty 1000

## 2018-07-31 MED ORDER — SENNOSIDES-DOCUSATE SODIUM 8.6-50 MG PO TABS: 1.0000 | ORAL_TABLET | Freq: Every evening | ORAL | Status: DC | PRN

## 2018-07-31 MED ORDER — ONDANSETRON HCL 4 MG/2ML IJ SOLN: 4.0000 mg | Freq: Four times a day (QID) | INTRAMUSCULAR | Status: DC | PRN

## 2018-07-31 MED ORDER — SODIUM CHLORIDE 0.9% FLUSH
3.0000 mL | Freq: Two times a day (BID) | INTRAVENOUS | Status: DC
  Administered 2018-07-31: 3 mL via INTRAVENOUS

## 2018-07-31 MED ORDER — CITALOPRAM HYDROBROMIDE 20 MG PO TABS
20.0000 mg | ORAL_TABLET | Freq: Every day | ORAL | Status: DC
  Administered 2018-07-31: 20 mg via ORAL
  Filled 2018-07-31: qty 1
  Filled 2018-07-31: qty 2

## 2018-07-31 MED ORDER — SODIUM CHLORIDE 0.9 % IV SOLN
510.0000 mg | Freq: Once | INTRAVENOUS | Status: AC
  Administered 2018-07-31: 510 mg via INTRAVENOUS
  Filled 2018-07-31: qty 17

## 2018-07-31 MED ORDER — LEVOTHYROXINE SODIUM 100 MCG PO TABS
100.0000 ug | ORAL_TABLET | Freq: Every day | ORAL | Status: DC
  Administered 2018-07-31: 100 ug via ORAL
  Filled 2018-07-31: qty 1

## 2018-07-31 MED ORDER — MAGNESIUM SULFATE 2 GM/50ML IV SOLN
2.0000 g | Freq: Once | INTRAVENOUS | Status: AC
  Administered 2018-07-31: 2 g via INTRAVENOUS
  Filled 2018-07-31: qty 50

## 2018-07-31 NOTE — ED Notes (Signed)
Arrived back from MRI.

## 2018-07-31 NOTE — ED Notes (Signed)
Dr Opyd at the bedside 

## 2018-07-31 NOTE — H&P (Addendum)
History and Physical    Lisa Quinn ZOX:[id number] DOB: December 29, 1965 DOA: 07/30/2018  PCP:  Iverson Alamin, MD ([place] state)    Patient coming from: Home   Chief Complaint: Fall, unresponsive   HPI: Lisa Quinn is a 52 y.o. 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  Patient reports that she has been lightheaded for the past day, worse upon standing, and reports that she frequently gets these symptoms when menstruating.  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  There was no seizure-like activity, but family and bystanders could not get the patient to respond.  By time of admission, she is answering questions appropriately, denies any preceding chest pain or palpitations and denies any headache or change in vision or hearing.  Denies any shortness of breath, cough, or recent fevers or chills.  ED Course: Upon arrival to the ED, patient is found to be afebrile, saturating well on room air, and with vitals otherwise stable.  EKG features a sinus rhythm and noncontrast head CT is negative for acute intracranial abnormality.  Chemistry panel is notable for a potassium 3.2 and CBC features a stable microcytic anemia with hemoglobin of 8.5.  TSH was elevated with normal T4.  Lactic acid was elevated to 3.0 initially, normalized to 1.0 after a liter of IV fluids.  Troponin is undetectable and INR normal.  Patient was not moving any extremities and had asymmetric pupils in the ED, prompting a code stroke to be called.  She has had a MRI brain and cervical spine no with MRI brain negative and cervical spine imaging notable for mild to moderate canal stenosis at C3-4 and moderate  right C4-5 neural foraminal narrowing.  Patient was evaluated by neurology.  She will be observed for further evaluation and management of a syncopal episode.  Review of Systems:  All other systems reviewed and apart from HPI, are negative.  Past Medical History:  Diagnosis Date  . Hypothyroidism     Past Surgical History:  Procedure Laterality Date  . BIOPSY THYROID  2017     reports that she has never smoked. She has never used smokeless tobacco. She reports that she drinks alcohol. She reports that she does not use drugs.  Not on File  Family History  Problem Relation Age of Onset  . Hypertension Mother   . Colon cancer Father      Prior to Admission medications   Not on File    Physical Exam: Vitals:   07/30/18 2245 07/30/18 2300 07/30/18 2315 07/31/18 0134  BP: 131/78 130/83 127/83 123/82  Pulse: 72 65 63 (!) 59  Resp: (!) 25 18 20 19   Temp:      TempSrc:      SpO2: 100% 100% 100% 99%  Weight:      Height:        Constitutional: NAD, calm  Eyes: PERTLA, lids and conjunctivae normal ENMT: Mucous membranes are moist. Posterior pharynx clear of any exudate or lesions.   Neck: normal, supple, no masses, no thyromegaly Respiratory: clear to auscultation bilaterally, no wheezing, no crackles. Normal respiratory effort.    Cardiovascular: S1 & S2 heard, regular rate and rhythm. No extremity edema.   Abdomen: No distension, no tenderness, soft.  Bowel sounds normal.  Musculoskeletal: no clubbing / cyanosis. No joint deformity upper and lower extremities.   Skin: no significant rashes, lesions, ulcers. Warm, dry, well-perfused. Neurologic: No gross facial asymmetry. Patellar DTRs normal. Moving all extremities spontaneously.  Psychiatric: Alert and oriented to person, place, and situation. Pleasant and cooperative.    Labs on Admission: I have personally reviewed following labs and imaging studies  CBC: Recent Labs  Lab 07/30/18 2145 07/30/18 2204  07/30/18 2207  WBC 4.3  --   --   NEUTROABS  --  1.5*  --   HGB 8.5*  --  10.2*  HCT 29.9*  --  30.0*  MCV 68.4*  --   --   PLT 199  --   --    Basic Metabolic Panel: Recent Labs  Lab 07/30/18 2145 07/30/18 2207  NA 135 139  K 3.2* 3.2*  CL 104 106  CO2 20*  --   GLUCOSE 169* 163*  BUN 10 10  CREATININE 0.95 0.90  CALCIUM 8.0*  --    GFR: Estimated Creatinine Clearance: 87.6 mL/min (by C-G formula based on SCr of 0.9 mg/dL). Liver Function Tests: Recent Labs  Lab 07/30/18 2145  AST 23  ALT 14  ALKPHOS 60  BILITOT 0.5  PROT 7.3  ALBUMIN 3.6   No results for input(s): LIPASE, AMYLASE in the last 168 hours. No results for input(s): AMMONIA in the last 168 hours. Coagulation Profile: Recent Labs  Lab 07/30/18 2204  INR 1.10   Cardiac Enzymes: No results for input(s): CKTOTAL, CKMB, CKMBINDEX, TROPONINI in the last 168 hours. BNP (last 3 results) No results for input(s): PROBNP in the last 8760 hours. HbA1C: No results for input(s): HGBA1C in the last 72 hours. CBG: Recent Labs  Lab 07/30/18 2155  GLUCAP 143*   Lipid Profile: No results for input(s): CHOL, HDL, LDLCALC, TRIG, CHOLHDL, LDLDIRECT in the last 72 hours. Thyroid Function Tests: Recent Labs    07/30/18 2145 07/30/18 2146  TSH  --  10.639*  FREET4 0.83  --    Anemia Panel: No results for input(s): VITAMINB12, FOLATE, FERRITIN, TIBC, IRON, RETICCTPCT in the last 72 hours. Urine analysis: No results found for: COLORURINE, APPEARANCEUR, LABSPEC, PHURINE, GLUCOSEU, HGBUR, BILIRUBINUR, KETONESUR, PROTEINUR, UROBILINOGEN, NITRITE, LEUKOCYTESUR Sepsis Labs: @LABRCNTIP (procalcitonin:4,lacticidven:4) )No results found for this or any previous visit (from the past 240 hour(s)).   Radiological Exams on Admission: Ct Angio Head W Or Wo Contrast  Result Date: 07/30/2018 CLINICAL DATA:  Confusion. Syncopal episode. Found unresponsive between bleachers at football game. Last seen normal at 2130  hours. EXAM: CT ANGIOGRAPHY HEAD AND NECK TECHNIQUE: Multidetector CT imaging of the head and neck was performed using the standard protocol during bolus administration of intravenous contrast. Multiplanar CT image reconstructions and MIPs were obtained to evaluate the vascular anatomy. Carotid stenosis measurements (when applicable) are obtained utilizing NASCET criteria, using the distal internal carotid diameter as the denominator. CONTRAST:  15mL ISOVUE-370 IOPAMIDOL (ISOVUE-370) INJECTION 76% COMPARISON:  CT HEAD July 20, 2018 at 2218 hours. FINDINGS: CTA NECK FINDINGS: AORTIC ARCH: Normal appearance of the thoracic arch, normal branch pattern. The origins of the innominate, left Common carotid artery and subclavian artery are widely patent. RIGHT CAROTID SYSTEM: Common carotid artery is patent. Normal appearance of the carotid bifurcation without hemodynamically significant stenosis by NASCET criteria. Normal appearance of the internal carotid artery. LEFT CAROTID SYSTEM: Common carotid artery is patent. Normal appearance of the carotid bifurcation without hemodynamically significant stenosis by NASCET criteria. Normal  appearance of the internal carotid artery. VERTEBRAL ARTERIES:Codominant vertebral arteries, widely patent. Mild extrinsic compression due to cervical spine. SKELETON: No acute osseous process though bone windows have not been submitted. OTHER NECK: Soft tissues of the neck are nonacute though, not tailored for evaluation. Thoracic dextroscoliosis. Coarse calcification anterior neck. UPPER CHEST: Biapical pleural thickening. LEFT apical granuloma. 3.7 cm enlarged main pulmonary artery associated with pulmonary arterial hypertension, incompletely evaluated. Status post thyroidectomy, subcentimeter residual thyroid tissue. Surgical clips in anterior mediastinum. Status post median sternotomy. CTA HEAD FINDINGS: ANTERIOR CIRCULATION: Patent cervical internal carotid arteries, petrous, cavernous  and supra clinoid internal carotid arteries. Patent anterior communicating artery. Patent anterior and middle cerebral arteries, mild luminal irregularity. No large vessel occlusion, significant stenosis, contrast extravasation or aneurysm. POSTERIOR CIRCULATION: Patent vertebral arteries, vertebrobasilar junction and basilar artery, as well as main branch vessels. Mild stenosis RIGHT V4 segment. Patent posterior cerebral arteries, mild luminal irregularity. Small bilateral posterior communicating arteries present. No large vessel occlusion, significant stenosis, contrast extravasation or aneurysm. VENOUS SINUSES: Major dural venous sinuses are patent though not tailored for evaluation on this angiographic examination. ANATOMIC VARIANTS: Hypoplastic RIGHT A1 segment.  LEFT DELAYED PHASE: Not performed. MIP images reviewed. IMPRESSION: CTA NECK: 1. No hemodynamically significant stenosis or acute vascular process in the neck. 2. Postoperative changes mediastinal, status post median sternotomy. 3. Enlarged main pulmonary artery associated with pulmonary arterial hypertension. CTA HEAD: 1. No emergent large vessel occlusion or flow-limiting stenosis. 2. Mild luminal irregularities seen with atherosclerosis, less likely vasculopathy. Critical Value/emergent results text paged to Dr.ASHISH ARORA via AMION secure system on 07/30/2018 at 10:35 pm, including interpreting physician's phone number. Electronically Signed   By: Awilda Metro M.D.   On: 07/30/2018 22:45   Ct Angio Neck W And/or Wo Contrast  Result Date: 07/30/2018 CLINICAL DATA:  Confusion. Syncopal episode. Found unresponsive between bleachers at football game. Last seen normal at 2130 hours. EXAM: CT ANGIOGRAPHY HEAD AND NECK TECHNIQUE: Multidetector CT imaging of the head and neck was performed using the standard protocol during bolus administration of intravenous contrast. Multiplanar CT image reconstructions and MIPs were obtained to evaluate the  vascular anatomy. Carotid stenosis measurements (when applicable) are obtained utilizing NASCET criteria, using the distal internal carotid diameter as the denominator. CONTRAST:  50mL ISOVUE-370 IOPAMIDOL (ISOVUE-370) INJECTION 76% COMPARISON:  CT HEAD July 20, 2018 at 2218 hours. FINDINGS: CTA NECK FINDINGS: AORTIC ARCH: Normal appearance of the thoracic arch, normal branch pattern. The origins of the innominate, left Common carotid artery and subclavian artery are widely patent. RIGHT CAROTID SYSTEM: Common carotid artery is patent. Normal appearance of the carotid bifurcation without hemodynamically significant stenosis by NASCET criteria. Normal appearance of the internal carotid artery. LEFT CAROTID SYSTEM: Common carotid artery is patent. Normal appearance of the carotid bifurcation without hemodynamically significant stenosis by NASCET criteria. Normal appearance of the internal carotid artery. VERTEBRAL ARTERIES:Codominant vertebral arteries, widely patent. Mild extrinsic compression due to cervical spine. SKELETON: No acute osseous process though bone windows have not been submitted. OTHER NECK: Soft tissues of the neck are nonacute though, not tailored for evaluation. Thoracic dextroscoliosis. Coarse calcification anterior neck. UPPER CHEST: Biapical pleural thickening. LEFT apical granuloma. 3.7 cm enlarged main pulmonary artery associated with pulmonary arterial hypertension, incompletely evaluated. Status post thyroidectomy, subcentimeter residual thyroid tissue. Surgical clips in anterior mediastinum. Status post median sternotomy. CTA HEAD FINDINGS: ANTERIOR CIRCULATION: Patent cervical internal carotid arteries, petrous, cavernous and supra clinoid internal carotid arteries. Patent anterior communicating artery. Patent anterior and middle  cerebral arteries, mild luminal irregularity. No large vessel occlusion, significant stenosis, contrast extravasation or aneurysm. POSTERIOR CIRCULATION: Patent  vertebral arteries, vertebrobasilar junction and basilar artery, as well as main branch vessels. Mild stenosis RIGHT V4 segment. Patent posterior cerebral arteries, mild luminal irregularity. Small bilateral posterior communicating arteries present. No large vessel occlusion, significant stenosis, contrast extravasation or aneurysm. VENOUS SINUSES: Major dural venous sinuses are patent though not tailored for evaluation on this angiographic examination. ANATOMIC VARIANTS: Hypoplastic RIGHT A1 segment.  LEFT DELAYED PHASE: Not performed. MIP images reviewed. IMPRESSION: CTA NECK: 1. No hemodynamically significant stenosis or acute vascular process in the neck. 2. Postoperative changes mediastinal, status post median sternotomy. 3. Enlarged main pulmonary artery associated with pulmonary arterial hypertension. CTA HEAD: 1. No emergent large vessel occlusion or flow-limiting stenosis. 2. Mild luminal irregularities seen with atherosclerosis, less likely vasculopathy. Critical Value/emergent results text paged to Dr.ASHISH ARORA via AMION secure system on 07/30/2018 at 10:35 pm, including interpreting physician's phone number. Electronically Signed   By: Awilda Metro M.D.   On: 07/30/2018 22:45   Mr Brain Wo Contrast (neuro Protocol)  Result Date: 07/31/2018 CLINICAL DATA:  TIA. Found unresponsive between bleachers at football game. History of stroke. EXAM: MRI HEAD WITHOUT CONTRAST MRI CERVICAL SPINE WITHOUT CONTRAST TECHNIQUE: Multiplanar, multiecho pulse sequences of the brain and surrounding structures, and cervical spine, to include the craniocervical junction and cervicothoracic junction, were obtained without intravenous contrast. COMPARISON:  CT HEAD and cervical spine July 30, 2018 FINDINGS: MRI HEAD FINDINGS INTRACRANIAL CONTENTS: No reduced diffusion to suggest acute ischemia or hyperacute demyelination. No susceptibility artifact to suggest hemorrhage. The ventricles and sulci are normal for  patient's age. No suspicious parenchymal signal, masses, mass effect. No abnormal extra-axial fluid collections. No extra-axial masses. VASCULAR: Normal major intracranial vascular flow voids present at skull base. SKULL AND UPPER CERVICAL SPINE: No abnormal sellar expansion. Small potential arachnoid cyst within superior sella displacing the infundibulum posteriorly. No suspicious calvarial bone marrow signal. Craniocervical junction maintained. SINUSES/ORBITS: Trace RIGHT mastoid effusion. Minimal LEFT ethmoid mucosal thickening. LEFT medial orbital blowout fracture. OTHER: None. MRI CERVICAL SPINE FINDINGS ALIGNMENT: Straightened cervical lordosis. No malalignment. Cervicothoracic levoscoliosis. VERTEBRAE/DISCS: Vertebral bodies are intact. Intervertebral disc morphology's and signal are normal. Mild C4-5 and C5-6 changes chronic. Discogenic endplate no abnormal or acute bone marrow signal. 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 CORD:Cervical spinal cord is normal morphology and signal characteristics from the cervicomedullary junction to level of T2-3, the most caudal well visualized level. No susceptibility artifact to suggest blood products. POSTERIOR FOSSA, VERTEBRAL ARTERIES, PARASPINAL TISSUES: No MR findings of ligamentous injury. Vertebral artery flow voids present. Included posterior fossa and paraspinal soft tissues are normal. DISC LEVELS: C2-3: No disc bulge, canal stenosis nor neural foraminal narrowing. C3-4: Small broad-based disc protrusion, mild-to-moderate canal stenosis. Mild RIGHT neural foraminal narrowing. C4-5: Uncovertebral hypertrophy asymmetric to the RIGHT. No canal stenosis. Moderate RIGHT neural foraminal narrowing. C5-6 through C7-T1: No disc bulge, canal stenosis nor neural foraminal narrowing. IMPRESSION: MRI head: 1. Negative noncontrast MRI head. MRI cervical spine: 1. No fracture, malalignment or acute osseous process. 2. Mild-to-moderate canal stenosis  C3-4. Moderate RIGHT C4-5 neural foraminal narrowing. Electronically Signed   By: Awilda Metro M.D.   On: 07/31/2018 01:37   Mr Cervical Spine Wo Contrast  Result Date: 07/31/2018 CLINICAL DATA:  TIA. Found unresponsive between bleachers at football game. History of stroke. EXAM: MRI HEAD WITHOUT CONTRAST MRI CERVICAL SPINE WITHOUT CONTRAST TECHNIQUE: Multiplanar, multiecho pulse sequences  of the brain and surrounding structures, and cervical spine, to include the craniocervical junction and cervicothoracic junction, were obtained without intravenous contrast. COMPARISON:  CT HEAD and cervical spine July 30, 2018 FINDINGS: MRI HEAD FINDINGS INTRACRANIAL CONTENTS: No reduced diffusion to suggest acute ischemia or hyperacute demyelination. No susceptibility artifact to suggest hemorrhage. The ventricles and sulci are normal for patient's age. No suspicious parenchymal signal, masses, mass effect. No abnormal extra-axial fluid collections. No extra-axial masses. VASCULAR: Normal major intracranial vascular flow voids present at skull base. SKULL AND UPPER CERVICAL SPINE: No abnormal sellar expansion. Small potential arachnoid cyst within superior sella displacing the infundibulum posteriorly. No suspicious calvarial bone marrow signal. Craniocervical junction maintained. SINUSES/ORBITS: Trace RIGHT mastoid effusion. Minimal LEFT ethmoid mucosal thickening. LEFT medial orbital blowout fracture. OTHER: None. MRI CERVICAL SPINE FINDINGS ALIGNMENT: Straightened cervical lordosis. No malalignment. Cervicothoracic levoscoliosis. VERTEBRAE/DISCS: Vertebral bodies are intact. Intervertebral disc morphology's and signal are normal. Mild C4-5 and C5-6 changes chronic. Discogenic endplate no abnormal or acute bone marrow signal. 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 CORD:Cervical spinal cord is normal morphology and signal characteristics from the cervicomedullary junction to level of T2-3, the  most caudal well visualized level. No susceptibility artifact to suggest blood products. POSTERIOR FOSSA, VERTEBRAL ARTERIES, PARASPINAL TISSUES: No MR findings of ligamentous injury. Vertebral artery flow voids present. Included posterior fossa and paraspinal soft tissues are normal. DISC LEVELS: C2-3: No disc bulge, canal stenosis nor neural foraminal narrowing. C3-4: Small broad-based disc protrusion, mild-to-moderate canal stenosis. Mild RIGHT neural foraminal narrowing. C4-5: Uncovertebral hypertrophy asymmetric to the RIGHT. No canal stenosis. Moderate RIGHT neural foraminal narrowing. C5-6 through C7-T1: No disc bulge, canal stenosis nor neural foraminal narrowing. IMPRESSION: MRI head: 1. Negative noncontrast MRI head. MRI cervical spine: 1. No fracture, malalignment or acute osseous process. 2. 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 Moderate RIGHT C4-5 neural foraminal narrowing. Electronically Signed   By: Awilda Metro M.D.   On: 07/31/2018 01:37   Ct C-spine No Charge  Result Date: 07/30/2018 CLINICAL DATA:  Found unresponsive between bleachers at football game. EXAM: CT CERVICAL SPINE WITHOUT CONTRAST TECHNIQUE: Reconstructed CT imaging of the cervical spine was performed. COMPARISON:  None. FINDINGS: ALIGNMENT: Maintained lordosis. Vertebral bodies in alignment. SKULL BASE AND VERTEBRAE: Cervical vertebral bodies and posterior elements are intact. Developmentally long gated C6 spinous process directed to the LEFT. Dystrophic RIGHT C6 transverse process, anterior tubercle of C6 and C7 pseudoarthrosis. The posterior elements of T1 are developmentally unfused. Intervertebral disc heights preserved. No destructive bony lesions. C1-2 articulation maintained. SOFT TISSUES AND SPINAL CANAL: Nonacute. DISC LEVELS: Moderate C3-4 disc protrusion resulting in at least moderate canal stenosis UPPER CHEST: Lung apices are clear. OTHER: None. IMPRESSION: 1. No fracture or malalignment. 2. Disc protrusion  resulting in at least moderate canal stenosis C3-4. Electronically Signed   By: Awilda Metro M.D.   On: 07/30/2018 23:15   Ct Head Code Stroke Wo Contrast  Addendum Date: 07/30/2018   ADDENDUM REPORT: 07/30/2018 22:33 ADDENDUM: Partially empty sella. Electronically Signed   By: Awilda Metro M.D.   On: 07/30/2018 22:33   Result Date: 07/30/2018 CLINICAL DATA:  Code stroke.  Syncope and confusion. EXAM: CT HEAD WITHOUT CONTRAST TECHNIQUE: Contiguous axial images were obtained from the base of the skull through the vertex without intravenous contrast. COMPARISON:  None. FINDINGS: BRAIN: No intraparenchymal hemorrhage, mass effect nor midline shift. The ventricles and sulci are normal. No acute large vascular territory infarcts. No abnormal extra-axial fluid collections. Basal cisterns  are patent. VASCULAR: Trace calcific atherosclerosis. SKULL/SOFT TISSUES: No skull fracture. No significant soft tissue swelling. ORBITS/SINUSES: Old LEFT medial orbital blowout fracture. Paranasal sinuses are well aerated. Mastoid air cells are well aerated. OTHER: None. ASPECTS [facility] Stroke Program Early CT Score) - Ganglionic level infarction (caudate, lentiform nuclei, internal capsule, insula, M1-M3 cortex): 7 - Supraganglionic infarction (M4-M6 cortex): 3 Total score (0-10 with 10 being normal): 10 IMPRESSION: 1. Negative CT HEAD without contrast. 2. ASPECTS is 10. 3. Critical Value/emergent results text paged to Dr.Arora, Neurology via AMION secure system on 07/30/2018 at 10:24 pm, including interpreting physician's phone number. Electronically Signed: By: Awilda Metro M.D. On: 07/30/2018 22:25    EKG: Independently reviewed. Sinus rhythm.   Assessment/Plan  1. Syncope  - Presents following a transient loss of consciousness that occurred upon standing  - She had been feeling lightheaded earlier in the day but not experiencing any chest pain or palpitations  - EKG with sinus rhythm in ED  - Continue  cardiac monitoring, check orthostatics although she has already been given a 1 liter fluid bolus, and check echocardiogram    2. Acute encephalopathy  - Patient was not responding initially in ED and not moving any extremity; this seems to have resolved  - By time of admission, she is answering questions appropriately and moving all extremities  - Possibly secondary to mild TBI though family did not think she hit her head  - Of note, she had similar ED presentation in 2017 with AMS and quadriparesis, negative MRI brain, and rapid return to normal   - Continue neuro checks for now, check UDS  3. Hypokalemia  - Serum potassium is 3.2  - KCl added to IVF, mag supplemented empirically  - Repeat chem panel in am   4. Hypothyroidism  - TSH elevated in ED with normal T4  - Continue Synthroid    5. Microcytic anemia  - Hgb is 8.5 on admission with MCV 68.4  - She is menstruating, but denies melena or hematochezia and CBC is similar to priors    6. Depression  - Continue Celexa    DVT prophylaxis: SCD's  Code Status: Full  Family Communication: Family updated at bedside Consults called: Neurology Admission status: Observation     Briscoe Deutscher, MD [facility] Pager [phone]  If 7PM-7AM, please contact night-coverage [email or web address] Password [email or web address]  07/31/2018, 1:57 AM

## 2018-07-31 NOTE — Plan of Care (Signed)
Patient remains altered/sleepy at this time.

## 2018-07-31 NOTE — ED Notes (Signed)
Spoke with Clydie Braun from main lab, she is processing INR, Diff, and etoh blood test now.

## 2018-07-31 NOTE — Evaluation (Signed)
Physical Therapy Evaluation Patient Details Name: Lisa Quinn MRN: [id number] DOB: 06/06/1966 Today's Date: 07/31/2018   History of Present Illness  Pt is a 52 y.o. F with significant PMH of hypothryoidism, depression, microcytic anemia who presents after a transient LOC. She was at a college football game, was standing up, and fell backwards. MRI (-) for acute abnormality. 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  Clinical Impression  Patient is independent at baseline and works as a Social worker. Denies previous episodes similar to this in the past or history of falling. On PT evaluation, patient ambulating in room with min guard assist due to mild unsteadiness. Evaluation limited secondary to patient drowsiness/lethargy with increased difficulty keeping her eyes open. Stated she was "lightheadedness," and I was not able to reproduce any symptoms of dizziness during ambulation. Orthostatics assessed and stable (see vitals flowsheet). Patient plans to return home to [place], [place] to live with son who can provide necessary assistance.    Follow Up Recommendations Outpatient PT;Supervision for mobility/OOB    Equipment Recommendations  None recommended by PT    Recommendations for Other Services       Precautions / Restrictions Precautions Precautions: Fall Restrictions Weight Bearing Restrictions: No      Mobility  Bed Mobility Overal bed mobility: Independent                Transfers Overall transfer level: Needs assistance Equipment used: None Transfers: Sit to/from Stand Sit to Stand: Supervision            Ambulation/Gait Ambulation/Gait assistance: Min guard Gait Distance (Feet): 30 Feet Assistive device: None Gait Pattern/deviations: Step-through pattern;Decreased stride length;Drifts right/left Gait velocity: decreased   General Gait Details: Patient with mildly unsteady gait and lateral sway, likely due to drowsiness and inability to  keep her eyes open  Stairs            Wheelchair Mobility    Modified Rankin (Stroke Patients Only) Modified Rankin (Stroke Patients Only) Pre-Morbid Rankin Score: No symptoms Modified Rankin: Moderately severe disability     Balance Overall balance assessment: Needs assistance Sitting-balance support: No upper extremity supported;Feet supported Sitting balance-Leahy Scale: Good       Standing balance-Leahy Scale: Fair                               Pertinent Vitals/Pain Pain Assessment: No/denies pain    Home Living Family/patient expects to be discharged to:: Private residence (from [facility]) Living Arrangements: Children(son) Available Help at Discharge: Family Type of Home: House Home Access: Level entry     Home Layout: One level        Prior Function Level of Independence: Independent         Comments: hair stylist      Hand Dominance        Extremity/Trunk Assessment   Upper Extremity Assessment Upper Extremity Assessment: Overall WFL for tasks assessed    Lower Extremity Assessment Lower Extremity Assessment: RLE deficits/detail;LLE deficits/detail RLE Deficits / Details: 5/5 LLE Deficits / Details: 5/5    Cervical / Trunk Assessment Cervical / Trunk Assessment: Normal  Communication   Communication: No difficulties  Cognition Arousal/Alertness: Lethargic Behavior During Therapy: WFL for tasks assessed/performed Overall Cognitive Status: Within Functional Limits for tasks assessed  General Comments: Patient very drowsy throughout session with difficulty keeping her eyes open      General Comments      Exercises     Assessment/Plan    PT Assessment Patient needs continued PT services  PT Problem List Decreased activity tolerance;Decreased balance;Decreased mobility       PT Treatment Interventions Gait training;Stair training;Functional mobility training;Therapeutic  activities;Therapeutic exercise;Balance training;Patient/family education    PT Goals (Current goals can be found in the Care Plan section)  Acute Rehab PT Goals Patient Stated Goal: not feel lightheaded PT Goal Formulation: With patient Time For Goal Achievement: 08/14/18 Potential to Achieve Goals: Good    Frequency Min 3X/week   Barriers to discharge        Co-evaluation               AM-PAC PT "6 Clicks" Daily Activity  Outcome Measure Difficulty turning over in bed (including adjusting bedclothes, sheets and blankets)?: None Difficulty moving from lying on back to sitting on the side of the bed? : None Difficulty sitting down on and standing up from a chair with arms (e.g., wheelchair, bedside commode, etc,.)?: A Little Help needed moving to and from a bed to chair (including a wheelchair)?: A Little Help needed walking in hospital room?: A Little Help needed climbing 3-5 steps with a railing? : A Little 6 Click Score: 20    End of Session Equipment Utilized During Treatment: Gait belt Activity Tolerance: Patient limited by fatigue Patient left: in bed;with call bell/phone within reach;with family/visitor present Nurse Communication: Mobility status PT Visit Diagnosis: Unsteadiness on feet (R26.81);History of falling (Z91.81)    Time: 2637-8588 PT Time Calculation (min) (ACUTE ONLY): 28 min   Charges:   PT Evaluation $PT Eval Moderate Complexity: 1 Mod PT Treatments $Therapeutic Activity: 8-22 mins        Laurina Bustle, PT, DPT Acute Rehabilitation Services  Pager: [phone]   Vanetta Mulders 07/31/2018, 1:03 PM

## 2018-07-31 NOTE — Progress Notes (Signed)
  Echocardiogram 2D Echocardiogram has been performed.  Lisa Quinn F 07/31/2018, 1:27 PM

## 2018-07-31 NOTE — Progress Notes (Signed)
Pt placed in wheelchair and taken to car. A&O, agrees with DC teaching. NAD. VSS.

## 2018-07-31 NOTE — Progress Notes (Signed)
When rounding to check on patient, still remains profoundly dizzy. Son at bedside with patient. Will continue to monitor.

## 2018-07-31 NOTE — Discharge Summary (Signed)
Physician Discharge Summary  Lisa Quinn ZOX:[id number] DOB: 12-27-1965 DOA: 07/30/2018  PCP: Patient, No Pcp Per  Admit date: 07/30/2018 Discharge date: 07/31/2018  Admitted From: home Discharge disposition: home   Recommendations for Outpatient Follow-Up:   1. To follow up in [place] with PCP and GYN for chronic medical issues   Discharge Diagnosis:   Principal Problem:   Syncope Active Problems:   Microcytic anemia   Hypokalemia   Hypothyroidism   Acute encephalopathy   Depression    Discharge Condition: Improved.  Diet recommendation: .  Regular.  Wound care: None.  Code status: Full.   History of Present Illness:   Lisa Quinn is a 52 y.o. 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  Patient reports that she has been lightheaded for the past day, worse upon standing, and reports that she frequently gets these symptoms when menstruating.  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  There was no seizure-like activity, but family and bystanders could not get the patient to respond.  By time of admission, she is answering questions appropriately, denies any preceding chest pain or palpitations and denies any headache or change in vision or hearing.  Denies any shortness of breath, cough, or recent fevers or chills.   Hospital Course by Problem:   Syncope  - Presents following a transient loss of consciousness that occurred upon standing  - She had been feeling lightheaded earlier in the day but not experiencing any chest pain or palpitations  - had not been drinking much that day as she was travelling from [place] to .  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    The results of significant diagnostics from this hospitalization (including imaging, microbiology, ancillary and laboratory) are listed below for reference.     Procedures and Diagnostic Studies:   Ct Angio Head W Or Wo Contrast  Result  Date: 07/30/2018 CLINICAL DATA:  Confusion. Syncopal episode. Found unresponsive between bleachers at football game. Last seen normal at 2130 hours. EXAM: CT ANGIOGRAPHY HEAD AND NECK TECHNIQUE: Multidetector CT imaging of the head and neck was performed using the standard protocol during bolus administration of intravenous contrast. Multiplanar CT image reconstructions and MIPs were obtained to evaluate the vascular anatomy. Carotid stenosis measurements (when applicable) are obtained utilizing NASCET criteria, using the distal internal carotid diameter as the denominator. CONTRAST:  74mL ISOVUE-370 IOPAMIDOL (ISOVUE-370)  INJECTION 76% COMPARISON:  CT HEAD July 20, 2018 at 2218 hours. FINDINGS: CTA NECK FINDINGS: AORTIC ARCH: Normal appearance of the thoracic arch, normal branch pattern. The origins of the innominate, left Common carotid artery and subclavian artery are widely patent. RIGHT CAROTID SYSTEM: Common carotid artery is patent. Normal appearance of the carotid bifurcation without hemodynamically significant stenosis by NASCET criteria. Normal appearance of the internal carotid artery. LEFT CAROTID SYSTEM: Common carotid artery is patent. Normal appearance of the carotid bifurcation without hemodynamically significant stenosis by NASCET criteria. Normal appearance of the internal carotid artery. VERTEBRAL ARTERIES:Codominant vertebral arteries, widely patent. Mild extrinsic compression due to cervical spine. SKELETON: No acute osseous process though bone windows have not been submitted. OTHER NECK: Soft tissues of the neck are nonacute though, not tailored for evaluation. Thoracic dextroscoliosis. Coarse calcification anterior neck. UPPER CHEST: Biapical pleural thickening. LEFT apical granuloma. 3.7 cm enlarged main pulmonary artery associated with pulmonary arterial hypertension, incompletely evaluated. Status post thyroidectomy, subcentimeter residual thyroid tissue. Surgical clips in anterior mediastinum. Status post median sternotomy. CTA HEAD FINDINGS: ANTERIOR CIRCULATION: Patent cervical internal carotid arteries, petrous, cavernous and supra clinoid internal carotid arteries. Patent anterior communicating artery. Patent anterior and middle cerebral arteries, mild luminal irregularity. No large vessel occlusion, significant stenosis, contrast extravasation or aneurysm. POSTERIOR CIRCULATION: Patent vertebral arteries, vertebrobasilar junction and basilar artery, as well as main branch vessels. Mild stenosis RIGHT V4 segment. Patent posterior cerebral arteries, mild luminal irregularity. Small bilateral posterior  communicating arteries present. No large vessel occlusion, significant stenosis, contrast extravasation or aneurysm. VENOUS SINUSES: Major dural venous sinuses are patent though not tailored for evaluation on this angiographic examination. ANATOMIC VARIANTS: Hypoplastic RIGHT A1 segment.  LEFT DELAYED PHASE: Not performed. MIP images reviewed. IMPRESSION: CTA NECK: 1. No hemodynamically significant stenosis or acute vascular process in the neck. 2. Postoperative changes mediastinal, status post median sternotomy. 3. Enlarged main pulmonary artery associated with pulmonary arterial hypertension. CTA HEAD: 1. No emergent large vessel occlusion or flow-limiting stenosis. 2. Mild luminal irregularities seen with atherosclerosis, less likely vasculopathy. Critical Value/emergent results text paged to Dr.ASHISH ARORA via [employer] secure system on 07/30/2018 at 10:35 pm, including interpreting physician's phone number. Electronically Signed   By: Awilda Metro M.D.   On: 07/30/2018 22:45   Ct Angio Neck W And/or Wo Contrast  Result Date: 07/30/2018 CLINICAL DATA:  Confusion. Syncopal episode. Found unresponsive between bleachers at football game. Last seen normal at 2130 hours. EXAM: CT ANGIOGRAPHY HEAD AND NECK TECHNIQUE: Multidetector CT imaging of the head and neck was performed using the standard protocol during bolus administration of intravenous contrast. Multiplanar CT image reconstructions and MIPs were obtained to evaluate the vascular anatomy. Carotid stenosis measurements (when applicable) are obtained utilizing NASCET criteria, using the distal internal carotid diameter as the denominator. CONTRAST:  47mL ISOVUE-370 IOPAMIDOL (ISOVUE-370) INJECTION 76% COMPARISON:  CT HEAD July 20, 2018 at 2218 hours. FINDINGS: CTA NECK FINDINGS: AORTIC ARCH: Normal appearance of the thoracic arch, normal branch pattern. The  origins of the innominate, left Common carotid artery and subclavian artery are widely patent.  RIGHT CAROTID SYSTEM: Common carotid artery is patent. Normal appearance of the carotid bifurcation without hemodynamically significant stenosis by NASCET criteria. Normal appearance of the internal carotid artery. LEFT CAROTID SYSTEM: Common carotid artery is patent. Normal appearance of the carotid bifurcation without hemodynamically significant stenosis by NASCET criteria. Normal appearance of the internal carotid artery. VERTEBRAL ARTERIES:Codominant vertebral arteries, widely patent. Mild extrinsic compression due to cervical spine. SKELETON: No acute osseous process though bone windows have not been submitted. OTHER NECK: Soft tissues of the neck are nonacute though, not tailored for evaluation. Thoracic dextroscoliosis. Coarse calcification anterior neck. UPPER CHEST: Biapical pleural thickening. LEFT apical granuloma. 3.7 cm enlarged main pulmonary artery associated with pulmonary arterial hypertension, incompletely evaluated. Status post thyroidectomy, subcentimeter residual thyroid tissue. Surgical clips in anterior mediastinum. Status post median sternotomy. CTA HEAD FINDINGS: ANTERIOR CIRCULATION: Patent cervical internal carotid arteries, petrous, cavernous and supra clinoid internal carotid arteries. Patent anterior communicating artery. Patent anterior and middle cerebral arteries, mild luminal irregularity. No large vessel occlusion, significant stenosis, contrast extravasation or aneurysm. POSTERIOR CIRCULATION: Patent vertebral arteries, vertebrobasilar junction and basilar artery, as well as main branch vessels. Mild stenosis RIGHT V4 segment. Patent posterior cerebral arteries, mild luminal irregularity. Small bilateral posterior communicating arteries present. No large vessel occlusion, significant stenosis, contrast extravasation or aneurysm. VENOUS SINUSES: Major dural venous sinuses are patent though not tailored for evaluation on this angiographic examination. ANATOMIC VARIANTS: Hypoplastic  RIGHT A1 segment.  LEFT DELAYED PHASE: Not performed. MIP images reviewed. IMPRESSION: CTA NECK: 1. No hemodynamically significant stenosis or acute vascular process in the neck. 2. Postoperative changes mediastinal, status post median sternotomy. 3. Enlarged main pulmonary artery associated with pulmonary arterial hypertension. CTA HEAD: 1. No emergent large vessel occlusion or flow-limiting stenosis. 2. Mild luminal irregularities seen with atherosclerosis, less likely vasculopathy. Critical Value/emergent results text paged to Dr.ASHISH ARORA via [employer] secure system on 07/30/2018 at 10:35 pm, including interpreting physician's phone number. Electronically Signed   By: Awilda Metro M.D.   On: 07/30/2018 22:45   Mr Brain Wo Contrast (neuro Protocol)  Result Date: 07/31/2018 CLINICAL DATA:  TIA. Found unresponsive between bleachers at football game. History of stroke. EXAM: MRI HEAD WITHOUT CONTRAST MRI CERVICAL SPINE WITHOUT CONTRAST TECHNIQUE: Multiplanar, multiecho pulse sequences of the brain and surrounding structures, and cervical spine, to include the craniocervical junction and cervicothoracic junction, were obtained without intravenous contrast. COMPARISON:  CT HEAD and cervical spine July 30, 2018 FINDINGS: MRI HEAD FINDINGS INTRACRANIAL CONTENTS: No reduced diffusion to suggest acute ischemia or hyperacute demyelination. No susceptibility artifact to suggest hemorrhage. The ventricles and sulci are normal for patient's age. No suspicious parenchymal signal, masses, mass effect. No abnormal extra-axial fluid collections. No extra-axial masses. VASCULAR: Normal major intracranial vascular flow voids present at skull base. SKULL AND UPPER CERVICAL SPINE: No abnormal sellar expansion. Small potential arachnoid cyst within superior sella displacing the infundibulum posteriorly. No suspicious calvarial bone marrow signal. Craniocervical junction maintained. SINUSES/ORBITS: Trace RIGHT mastoid effusion.  Minimal LEFT ethmoid mucosal thickening. LEFT medial orbital blowout fracture. OTHER: None. MRI CERVICAL SPINE FINDINGS ALIGNMENT: Straightened cervical lordosis. No malalignment. Cervicothoracic levoscoliosis. VERTEBRAE/DISCS: Vertebral bodies are intact. Intervertebral disc morphology's and signal are normal. Mild C4-5 and C5-6 changes chronic. Discogenic endplate no abnormal or acute bone marrow signal. 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 CORD:Cervical spinal cord is normal morphology and signal characteristics from the cervicomedullary junction to  level of T2-3, the most caudal well visualized level. No susceptibility artifact to suggest blood products. POSTERIOR FOSSA, VERTEBRAL ARTERIES, PARASPINAL TISSUES: No MR findings of ligamentous injury. Vertebral artery flow voids present. Included posterior fossa and paraspinal soft tissues are normal. DISC LEVELS: C2-3: No disc bulge, canal stenosis nor neural foraminal narrowing. C3-4: Small broad-based disc protrusion, mild-to-moderate canal stenosis. Mild RIGHT neural foraminal narrowing. C4-5: Uncovertebral hypertrophy asymmetric to the RIGHT. No canal stenosis. Moderate RIGHT neural foraminal narrowing. C5-6 through C7-T1: No disc bulge, canal stenosis nor neural foraminal narrowing. IMPRESSION: MRI head: 1. Negative noncontrast MRI head. MRI cervical spine: 1. No fracture, malalignment or acute osseous process. 2. 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 Moderate RIGHT C4-5 neural foraminal narrowing. Electronically Signed   By: Awilda Metro M.D.   On: 07/31/2018 01:37   Mr Cervical Spine Wo Contrast  Result Date: 07/31/2018 CLINICAL DATA:  TIA. Found unresponsive between bleachers at football game. History of stroke. EXAM: MRI HEAD WITHOUT CONTRAST MRI CERVICAL SPINE WITHOUT CONTRAST TECHNIQUE: Multiplanar, multiecho pulse sequences of the brain and surrounding structures, and cervical spine, to include the craniocervical junction  and cervicothoracic junction, were obtained without intravenous contrast. COMPARISON:  CT HEAD and cervical spine July 30, 2018 FINDINGS: MRI HEAD FINDINGS INTRACRANIAL CONTENTS: No reduced diffusion to suggest acute ischemia or hyperacute demyelination. No susceptibility artifact to suggest hemorrhage. The ventricles and sulci are normal for patient's age. No suspicious parenchymal signal, masses, mass effect. No abnormal extra-axial fluid collections. No extra-axial masses. VASCULAR: Normal major intracranial vascular flow voids present at skull base. SKULL AND UPPER CERVICAL SPINE: No abnormal sellar expansion. Small potential arachnoid cyst within superior sella displacing the infundibulum posteriorly. No suspicious calvarial bone marrow signal. Craniocervical junction maintained. SINUSES/ORBITS: Trace RIGHT mastoid effusion. Minimal LEFT ethmoid mucosal thickening. LEFT medial orbital blowout fracture. OTHER: None. MRI CERVICAL SPINE FINDINGS ALIGNMENT: Straightened cervical lordosis. No malalignment. Cervicothoracic levoscoliosis. VERTEBRAE/DISCS: Vertebral bodies are intact. Intervertebral disc morphology's and signal are normal. Mild C4-5 and C5-6 changes chronic. Discogenic endplate no abnormal or acute bone marrow signal. 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 CORD:Cervical spinal cord is normal morphology and signal characteristics from the cervicomedullary junction to level of T2-3, the most caudal well visualized level. No susceptibility artifact to suggest blood products. POSTERIOR FOSSA, VERTEBRAL ARTERIES, PARASPINAL TISSUES: No MR findings of ligamentous injury. Vertebral artery flow voids present. Included posterior fossa and paraspinal soft tissues are normal. DISC LEVELS: C2-3: No disc bulge, canal stenosis nor neural foraminal narrowing. C3-4: Small broad-based disc protrusion, mild-to-moderate canal stenosis. Mild RIGHT neural foraminal narrowing. C4-5: Uncovertebral  hypertrophy asymmetric to the RIGHT. No canal stenosis. Moderate RIGHT neural foraminal narrowing. C5-6 through C7-T1: No disc bulge, canal stenosis nor neural foraminal narrowing. IMPRESSION: MRI head: 1. Negative noncontrast MRI head. MRI cervical spine: 1. No fracture, malalignment or acute osseous process. 2. 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 Moderate RIGHT C4-5 neural foraminal narrowing. Electronically Signed   By: Awilda Metro M.D.   On: 07/31/2018 01:37   Ct C-spine No Charge  Result Date: 07/30/2018 CLINICAL DATA:  Found unresponsive between bleachers at football game. EXAM: CT CERVICAL SPINE WITHOUT CONTRAST TECHNIQUE: Reconstructed CT imaging of the cervical spine was performed. COMPARISON:  None. FINDINGS: ALIGNMENT: Maintained lordosis. Vertebral bodies in alignment. SKULL BASE AND VERTEBRAE: Cervical vertebral bodies and posterior elements are intact. Developmentally long gated C6 spinous process directed to the LEFT. Dystrophic RIGHT C6 transverse process, anterior tubercle of C6 and C7 pseudoarthrosis. The posterior elements of  T1 are developmentally unfused. Intervertebral disc heights preserved. No destructive bony lesions. C1-2 articulation maintained. SOFT TISSUES AND SPINAL CANAL: Nonacute. DISC LEVELS: Moderate C3-4 disc protrusion resulting in at least moderate canal stenosis UPPER CHEST: Lung apices are clear. OTHER: None. IMPRESSION: 1. No fracture or malalignment. 2. Disc protrusion resulting in at least moderate canal stenosis C3-4. Electronically Signed   By: Awilda Metro M.D.   On: 07/30/2018 23:15   Ct Head Code Stroke Wo Contrast  Addendum Date: 07/30/2018   ADDENDUM REPORT: 07/30/2018 22:33 ADDENDUM: Partially empty sella. Electronically Signed   By: Awilda Metro M.D.   On: 07/30/2018 22:33   Result Date: 07/30/2018 CLINICAL DATA:  Code stroke.  Syncope and confusion. EXAM: CT HEAD WITHOUT CONTRAST TECHNIQUE: Contiguous axial images were obtained from the  base of the skull through the vertex without intravenous contrast. COMPARISON:  None. FINDINGS: BRAIN: No intraparenchymal hemorrhage, mass effect nor midline shift. The ventricles and sulci are normal. No acute large vascular territory infarcts. No abnormal extra-axial fluid collections. Basal cisterns are patent. VASCULAR: Trace calcific atherosclerosis. SKULL/SOFT TISSUES: No skull fracture. No significant soft tissue swelling. ORBITS/SINUSES: Old LEFT medial orbital blowout fracture. Paranasal sinuses are well aerated. Mastoid air cells are well aerated. OTHER: None. ASPECTS [facility] Stroke Program Early CT Score) - Ganglionic level infarction (caudate, lentiform nuclei, internal capsule, insula, M1-M3 cortex): 7 - Supraganglionic infarction (M4-M6 cortex): 3 Total score (0-10 with 10 being normal): 10 IMPRESSION: 1. Negative CT HEAD without contrast. 2. ASPECTS is 10. 3. Critical Value/emergent results text paged to Dr.Arora, Neurology via [employer] secure system on 07/30/2018 at 10:24 pm, including interpreting physician's phone number. Electronically Signed: By: Awilda Metro M.D. On: 07/30/2018 22:25     Labs:   Basic Metabolic Panel: Recent Labs  Lab 07/30/18 2145 07/30/18 2207 07/31/18 0720  NA 135 139 137  K 3.2* 3.2* 3.9  CL 104 106 109  CO2 20*  --  21*  GLUCOSE 169* 163* 84  BUN 10 10 7   CREATININE 0.95 0.90 0.68  CALCIUM 8.0*  --  7.6*   GFR Estimated Creatinine Clearance: 104.4 mL/min (by C-G formula based on SCr of 0.68 mg/dL). Liver Function Tests: Recent Labs  Lab 07/30/18 2145  AST 23  ALT 14  ALKPHOS 60  BILITOT 0.5  PROT 7.3  ALBUMIN 3.6   No results for input(s): LIPASE, AMYLASE in the last 168 hours. No results for input(s): AMMONIA in the last 168 hours. Coagulation profile Recent Labs  Lab 07/30/18 2204  INR 1.10    CBC: Recent Labs  Lab 07/30/18 2145 07/30/18 2204 07/30/18 2207  WBC 4.3  --   --   NEUTROABS  --  1.5*  --   HGB 8.5*  --   10.2*  HCT 29.9*  --  30.0*  MCV 68.4*  --   --   PLT 199  --   --    Cardiac Enzymes: No results for input(s): CKTOTAL, CKMB, CKMBINDEX, TROPONINI in the last 168 hours. BNP: Invalid input(s): POCBNP CBG: Recent Labs  Lab 07/30/18 2155 07/31/18 0849  GLUCAP 143* 94   D-Dimer No results for input(s): DDIMER in the last 72 hours. Hgb A1c No results for input(s): HGBA1C in the last 72 hours. Lipid Profile No results for input(s): CHOL, HDL, LDLCALC, TRIG, CHOLHDL, LDLDIRECT in the last 72 hours. Thyroid function studies Recent Labs    07/30/18 2146  TSH 10.639*   Anemia work up Recent Labs    07/31/18  1234  FERRITIN 4*  TIBC 409  IRON 21*   Microbiology No results found for this or any previous visit (from the past 240 hour(s)).   Discharge Instructions:   Discharge Instructions    Diet general   Complete by:  As directed    Discharge instructions   Complete by:  As directed    Need close follow up with GYN for heavy periods, PCP For PRN Iron transfusions and adjustment of your synthroid--- be sure you are taking synthroid at least 30 min before eating 1st thing in the AM No driving until back to normal self and well rested   Increase activity slowly   Complete by:  As directed      Allergies as of 07/31/2018      Reactions   Latex Swelling      Medication List    TAKE these medications   citalopram 40 MG tablet Commonly known as:  CELEXA Take 40 mg by mouth daily.   levothyroxine 100 MCG tablet Commonly known as:  SYNTHROID, LEVOTHROID Take 100 mcg by mouth daily.   multivitamin with minerals Tabs tablet Take 1 tablet by mouth daily.      Follow-up Information    PCP in [place] Follow up.            Time coordinating discharge: 35 min  Signed:  Joseph Art  [facility] 07/31/2018, 2:26 PM

## 2018-07-31 NOTE — Progress Notes (Signed)
Patient came to MRI with her collar off.  When I asked who removed this she said she did.  Placed it back on her. Witness to this is Shayne Alken

## 2020-04-21 IMAGING — MR MR CERVICAL SPINE W/O CM
7 of 8 series · 29 of 48 positions shown · non-contrast
Comparison: CT HEAD and cervical spine July 30, 2018

CLINICAL DATA: TIA. Found unresponsive between bleachers at
football game. History of stroke.

EXAM:
MRI HEAD WITHOUT CONTRAST
MRI CERVICAL SPINE WITHOUT CONTRAST
TECHNIQUE: Multiplanar, multiecho pulse sequences of the brain and surrounding
structures, and cervical spine, to include the craniocervical
junction and cervicothoracic junction, were obtained without
intravenous contrast.

[Series 9: T2 · sagittal · 3.0mm · 0.69mm/px · 2 of 15 slices shown (1 of 2)]
[im 1/15]
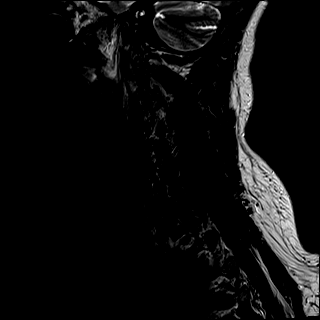
[im 15/15]
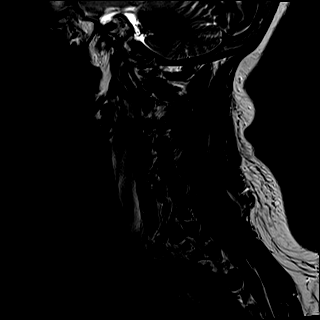

[Series 10: T1 · sagittal · 3.0mm · 0.69mm/px · 3 of 15 slices shown]
[im 1/15]
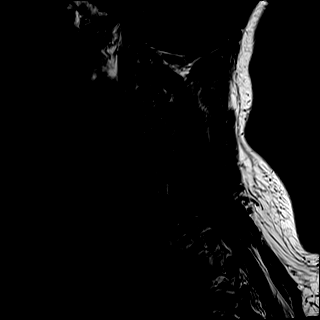
[im 8/15]
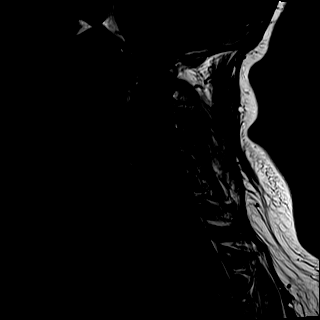
[im 15/15]
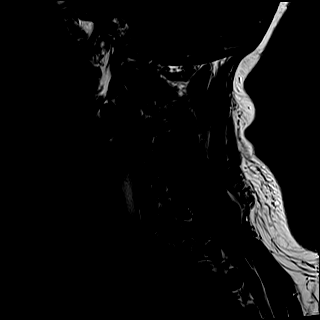

[Series 11: STIR · sagittal · 3.0mm · 0.86mm/px · 3 of 15 slices shown]
[im 1/15]
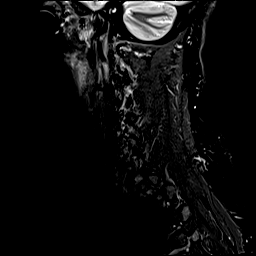
[im 8/15]
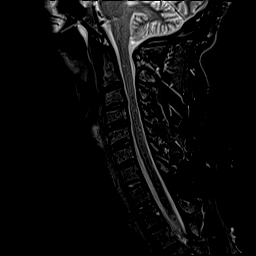
[im 15/15]
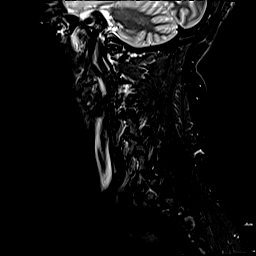

[Series 12: GRE · sagittal · 3.0mm · 0.39mm/px · 3 of 15 slices shown (1 of 2)]
[im 1/15]
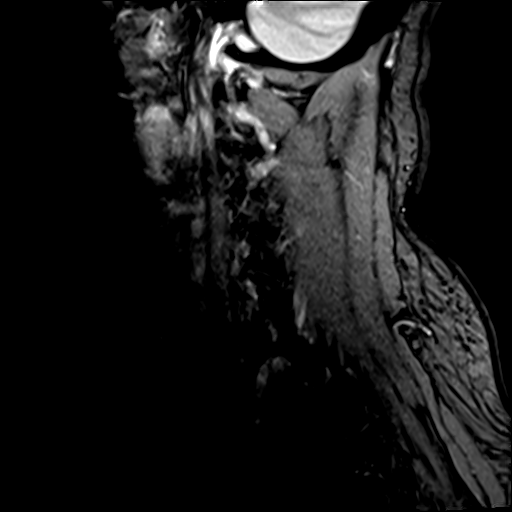
[im 8/15]
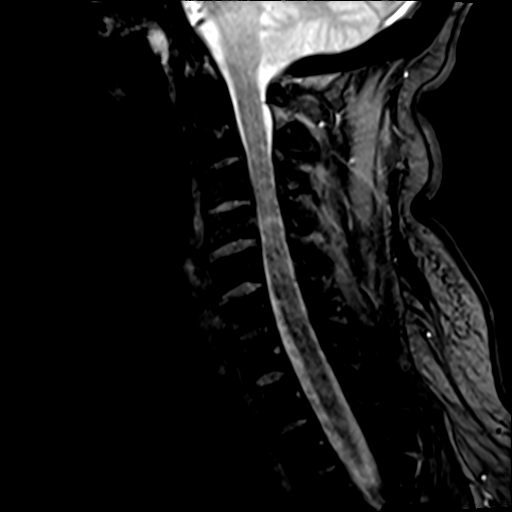
[im 15/15]
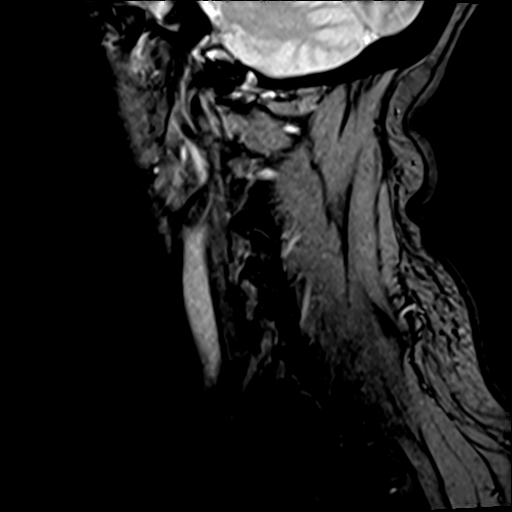

[Series 13: T2 · axial · 3.0mm · 0.66mm/px · z∈[-223,-88]mm · 9 of 44 slices shown (2 of 2)]
[im 1/44]
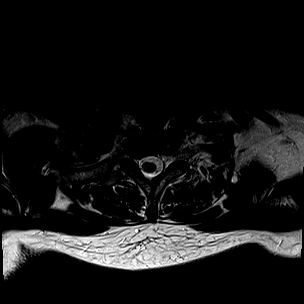
[im 6/44]
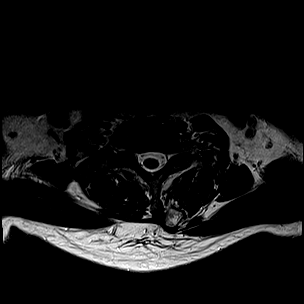
[im 11/44]
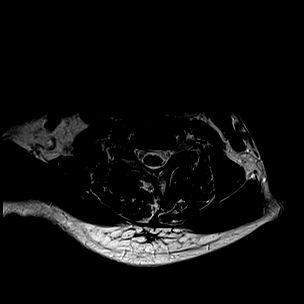
[im 17/44]
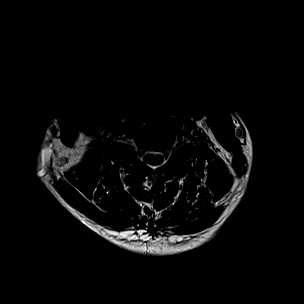
[im 22/44]
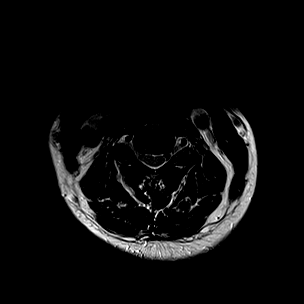
[im 27/44]
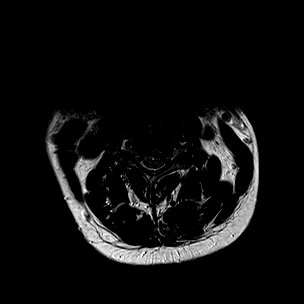
[im 33/44]
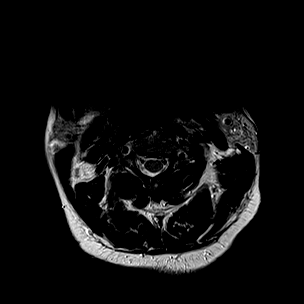
[im 38/44]
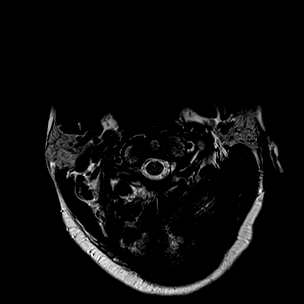
[im 44/44]
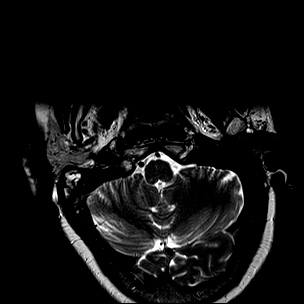

[Series 14: GRE · axial · 3.0mm · 0.39mm/px · z∈[-223,-88]mm · 8 of 44 slices shown (2 of 2)]
[im 1/44]
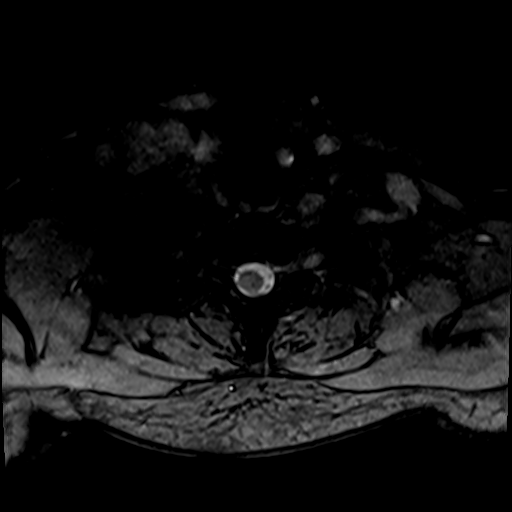
[im 6/44]
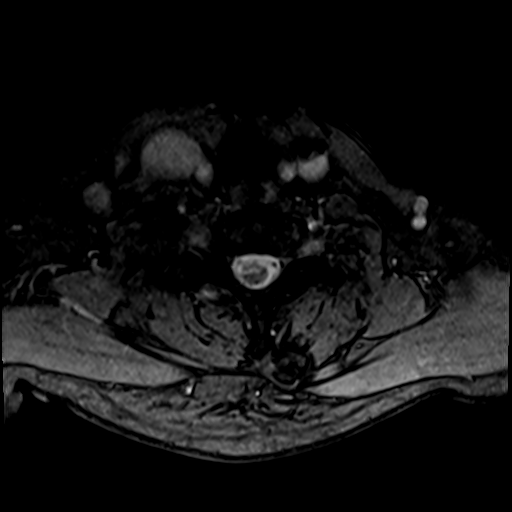
[im 11/44]
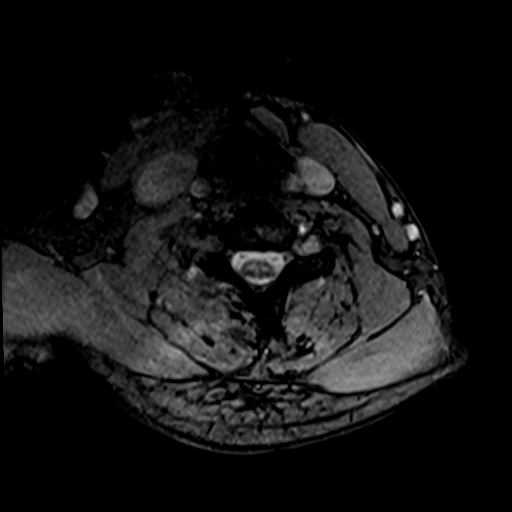
[im 17/44]
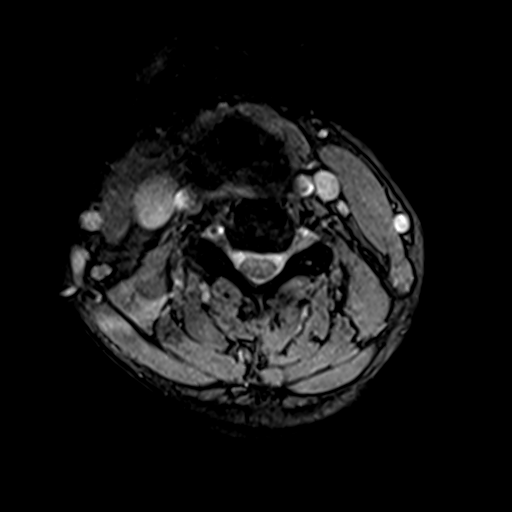
[im 27/44]
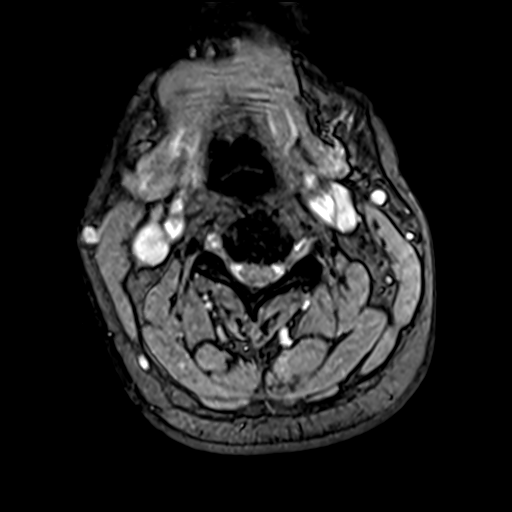
[im 33/44]
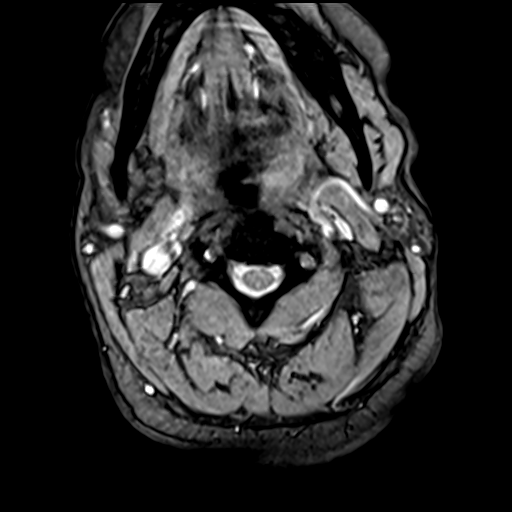
[im 38/44]
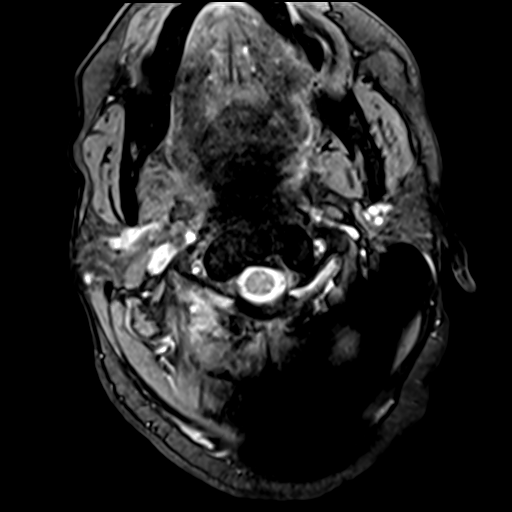
[im 44/44]
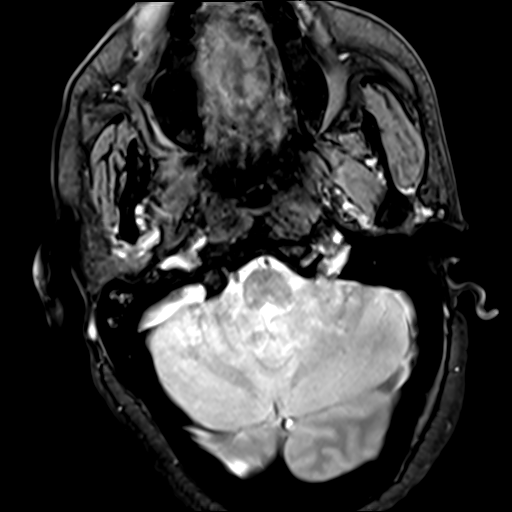

[Series 15: t2_(id)_tra_2mm · axial · 2.0mm · 0.35mm/px · 1 of 80 slices shown]
[im 6/80]
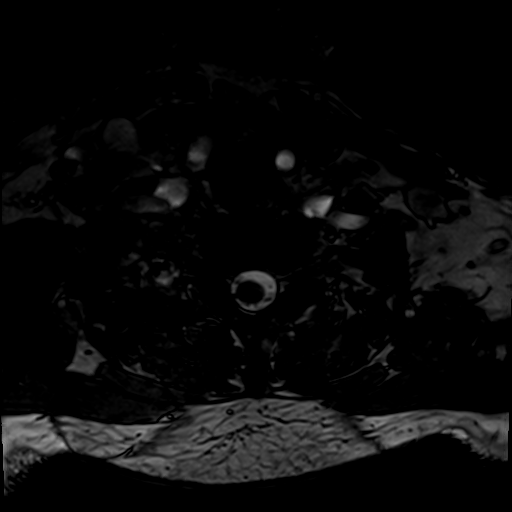

[29 of 48 positions shown; findings below may reference images not displayed]

FINDINGS: MRI HEAD FINDINGS

INTRACRANIAL CONTENTS: No reduced diffusion to suggest acute
ischemia or hyperacute demyelination. No susceptibility artifact to
suggest hemorrhage. The ventricles and sulci are normal for
patient's age. No suspicious parenchymal signal, masses, mass
effect. No abnormal extra-axial fluid collections. No extra-axial
masses.

VASCULAR: Normal major intracranial vascular flow voids present at
skull base.

SKULL AND UPPER CERVICAL SPINE: No abnormal sellar expansion. Small
potential arachnoid cyst within superior sella displacing the
infundibulum posteriorly. No suspicious calvarial bone marrow
signal. Craniocervical junction maintained.

SINUSES/ORBITS: Trace RIGHT mastoid effusion. Minimal LEFT ethmoid
mucosal thickening. LEFT medial orbital blowout fracture.

OTHER: None.

MRI CERVICAL SPINE FINDINGS

ALIGNMENT: Straightened cervical lordosis. No malalignment.
Cervicothoracic levoscoliosis.

VERTEBRAE/DISCS: Vertebral bodies are intact. Intervertebral disc
morphology's and signal are normal. Mild C4-5 and C5-6 changes
chronic. Discogenic endplate no abnormal or acute bone marrow
signal. Mild congenital canal narrowing of the upper cervical spine.

CORD:Cervical spinal cord is normal morphology and signal
characteristics from the cervicomedullary junction to level of T2-3,
the most caudal well visualized level. No susceptibility artifact to
suggest blood products.

POSTERIOR FOSSA, VERTEBRAL ARTERIES, PARASPINAL TISSUES: No MR
findings of ligamentous injury. Vertebral artery flow voids present.
Included posterior fossa and paraspinal soft tissues are normal.

DISC LEVELS:

C2-3: No disc bulge, canal stenosis nor neural foraminal narrowing.

C3-4: Small broad-based disc protrusion, mild-to-moderate canal
stenosis. Mild RIGHT neural foraminal narrowing.

C4-5: Uncovertebral hypertrophy asymmetric to the RIGHT. No canal
stenosis. Moderate RIGHT neural foraminal narrowing.

C5-6 through C7-T1: No disc bulge, canal stenosis nor neural
foraminal narrowing.
IMPRESSION: MRI head:

1. Negative noncontrast MRI head.

MRI cervical spine:

1. No fracture, malalignment or acute osseous process.
2. Mild-to-moderate canal stenosis C3-4. Moderate RIGHT C4-5 neural
foraminal narrowing.
